# Patient Record
Sex: Female | Born: 1980 | Race: Asian | Marital: Married | Smoking: Never smoker
Health system: Southern US, Community
[De-identification: ages and names within clinical notes are randomized; demographics above are authoritative.]

---

## 2021-01-04 ENCOUNTER — Encounter: Payer: Self-pay | Admitting: Cardiovascular Disease

## 2021-01-04 ENCOUNTER — Other Ambulatory Visit: Payer: Self-pay | Admitting: Cardiovascular Disease

## 2021-01-04 ENCOUNTER — Other Ambulatory Visit: Payer: Self-pay

## 2021-01-04 ENCOUNTER — Ambulatory Visit: Payer: Self-pay | Admitting: Cardiovascular Disease

## 2021-01-04 DIAGNOSIS — J029 Acute pharyngitis, unspecified: Secondary | ICD-10-CM | POA: Insufficient documentation

## 2021-01-04 DIAGNOSIS — M545 Low back pain, unspecified: Secondary | ICD-10-CM

## 2021-01-04 DIAGNOSIS — I10 Essential (primary) hypertension: Secondary | ICD-10-CM

## 2021-01-04 DIAGNOSIS — M549 Dorsalgia, unspecified: Secondary | ICD-10-CM | POA: Insufficient documentation

## 2021-01-04 MED ORDER — MELOXICAM 15 MG PO TABS: 15.0000 mg | ORAL_TABLET | Freq: Every day | ORAL | 4 refills | Status: DC

## 2021-01-04 MED ORDER — HYDROCORTISONE ACETATE 25 MG RE SUPP: 25.0000 mg | Freq: Two times a day (BID) | RECTAL | 0 refills | Status: DC

## 2021-01-04 NOTE — Progress Notes (Signed)
[name]  Date:  01/04/2021   ID:  Christina Robertson, DOB 1980-12-08, MRN [id number]  PCP:  Pcp, No    Chief Complaint  Patient presents with  . Hypertension  . Sore Throat      History of Present Illness: Christina Robertson is a 40 y.o. female who presents to establish care in this clinic.  She moved with her husband from [place].  She has history of GERD on chronic back pain requiring tramadol and meloxicam. In addition, she has chronic hemorrhoids with significant discomfort.  She uses MiraLAX for constipation. She had recent sore throat.  Her husband tested positive for COVID but she tested negative.  She has no shortness of breath, chest pain, fever or cough.    History reviewed. No pertinent past medical history.  History reviewed. No pertinent surgical history.   Current Outpatient Medications  Medication Sig Dispense Refill  . cetirizine-pseudoephedrine (ZYRTEC-D) 5-120 MG tablet Take 1 tablet by mouth daily. He has been taking it for 3 days for sore throat     No current facility-administered medications for this visit.    Allergies:   Patient has no allergy information on record.    Social History:  The patient  reports that she has never smoked. She has never used smokeless tobacco. She reports that she does not drink alcohol and does not use drugs.   Family History:  The patient's family history includes Diabetes in her father, mother, and sister; Hypertension in her sister.    ROS:  Please see the history of present illness.   Otherwise, review of systems are positive for none.   All other systems are reviewed and negative.    PHYSICAL EXAM: VS:  BP (!) 136/92 (BP Location: Right Arm, Patient Position: Sitting, Cuff Size: Normal)   Pulse 88   Temp 98.3 F (36.8 C)   Ht 6' 1.23" (1.86 m)   Wt 240 lb 3.2 oz (109 kg)   SpO2 98%   BMI 31.49 kg/m  , BMI Body mass index is 31.49 kg/m. GEN: Well nourished, well developed, in no acute distress  HEENT: normal   Neck: no JVD, carotid bruits, or masses Cardiac: RRR; no murmurs, rubs, or gallops,no edema  Respiratory:  clear to auscultation bilaterally, normal work of breathing GI: soft, nontender, nondistended, + BS MS: no deformity or atrophy  Skin: warm and dry, no rash Neuro:  Strength and sensation are intact Psych: euthymic mood, full affect     Recent Labs: No results found for requested labs within last 8760 hours.    Lipid Panel No results found for: CHOL, TRIG, HDL, CHOLHDL, VLDL, LDLCALC, LDLDIRECT    Wt Readings from Last 3 Encounters:  01/04/21 240 lb 3.2 oz (109 kg)       No flowsheet data found.    ASSESSMENT AND PLAN:  1.  Hemorrhoids: She seems to be symptomatic with significant discomfort.  This is a chronic issue for her.  She uses MiraLAX for constipation.  I advised her to start using Metamucil twice a day on a regular basis.  In addition, I prescribed Anusol suppositories for 6 days.  Surgery should be a last resort.  2.  Chronic pain: She is on tramadol which I do not think is a good option considering her young age but she reports being on it for 4 years.  I am referring her to the pain clinic with Dr. Welton Flakes.   3.  Sore throat: Overall very mild symptoms.  She might be in the early stages of COVID infection.  Continue symptomatic management.    Disposition:    Signed,  Lorine Bears, MD  01/04/2021 1:29 PM

## 2021-01-04 NOTE — Patient Instructions (Signed)
Start drinking Methamucil twice daily with meal.  Start Anusol Supp for 6 days.   Needs pain clinic.

## 2021-01-06 LAB — NOVEL CORONAVIRUS, NAA: SARS-CoV-2, NAA: NOT DETECTED

## 2021-01-06 LAB — SARS-COV-2, NAA 2 DAY TAT

## 2021-01-06 LAB — SPECIMEN STATUS REPORT

## 2021-01-18 ENCOUNTER — Encounter: Payer: Self-pay | Admitting: Internal Medicine

## 2021-01-18 ENCOUNTER — Ambulatory Visit: Payer: Self-pay | Admitting: Internal Medicine

## 2021-01-18 ENCOUNTER — Other Ambulatory Visit: Payer: Self-pay

## 2021-01-18 VITALS — BP 116/89 | HR 75 | Temp 98.7°F | Ht 64.17 in | Wt 168.2 lb

## 2021-01-18 DIAGNOSIS — H60502 Unspecified acute noninfective otitis externa, left ear: Secondary | ICD-10-CM | POA: Insufficient documentation

## 2021-01-18 MED ORDER — OFLOXACIN 0.3 % OP SOLN: 1.0000 [drp] | Freq: Four times a day (QID) | OPHTHALMIC | 0 refills | Status: DC

## 2021-01-18 NOTE — Assessment & Plan Note (Addendum)
Patient has evidence of otitis externa of the inner left ear. Will start her on Ofloxacin. Advised to return back to the dentist for a dental consult.

## 2021-01-18 NOTE — Progress Notes (Signed)
Acute Office Visit  Subjective:    Patient ID: Christina Robertson, female    DOB: 05-07-1981, 40 y.o.   MRN: [id number]  Left ear ache and left jaw pain and toothache. Pain has lasted 4 days. No history of fever, chills or sweating. Patient has trouble chewing on the left side. Patient also has pain in her left ear.   HPI Patient is in today for   History reviewed. No pertinent past medical history.  History reviewed. No pertinent surgical history.  Family History  Problem Relation Age of Onset   Diabetes Mother     Social History   Socioeconomic History   Marital status: Married    Spouse name: Not on file   Number of children: Not on file   Years of education: Not on file   Highest education level: Not on file  Occupational History   Not on file  Tobacco Use   Smoking status: Never   Smokeless tobacco: Never  Vaping Use   Vaping Use: Never used  Substance and Sexual Activity   Alcohol use: Never   Drug use: Never   Sexual activity: Yes  Other Topics Concern   Not on file  Social History Narrative   Not on file   Social Determinants of Health   Financial Resource Strain: Not on file  Food Insecurity: Not on file  Transportation Needs: Not on file  Physical Activity: Not on file  Stress: Not on file  Social Connections: Not on file  Intimate Partner Violence: Not on file    Outpatient Medications Prior to Visit  Medication Sig Dispense Refill   meloxicam (MOBIC) 15 MG tablet Take 1 tablet (15 mg total) by mouth daily. 30 tablet 4   omeprazole (PRILOSEC) 20 MG capsule Take 20 mg by mouth daily.     traMADol-acetaminophen (ULTRACET) 37.5-325 MG tablet Take 1 tablet by mouth every 8 (eight) hours as needed.     cetirizine-pseudoephedrine (ZYRTEC-D) 5-120 MG tablet Take 1 tablet by mouth daily. He has been taking it for 3 days for sore throat     hydrocortisone (ANUSOL-HC) 25 MG suppository Place 1 suppository (25 mg total) rectally 2 (two) times daily. 12  suppository 0   No facility-administered medications prior to visit.    Allergies  Allergen Reactions   Metamucil [Psyllium]     Review of Systems  Constitutional: Negative.   HENT: Negative.    Eyes: Negative.   Respiratory: Negative.    Cardiovascular: Negative.   Gastrointestinal: Negative.   Endocrine: Negative.   Genitourinary: Negative.   Musculoskeletal: Negative.   Skin: Negative.   Allergic/Immunologic: Negative.   Neurological: Negative.   Hematological: Negative.   Psychiatric/Behavioral: Negative.    All other systems reviewed and are negative.     Objective:    Physical Exam HENT:     Head: Normocephalic.     Left Ear: External ear normal.     Ears:     Comments: Evidence of otitis external on the left ear.     Nose: Nose normal.  Neurological:     Mental Status: She is alert.    BP 116/89 (BP Location: Right Arm, Patient Position: Sitting, Cuff Size: Normal)   Pulse 75   Temp 98.7 F (37.1 C) (Oral)   Ht 5' 4.17" (1.63 m)   Wt 168 lb 3.2 oz (76.3 kg)   SpO2 98%   BMI 28.72 kg/m  Wt Readings from Last 3 Encounters:  01/18/21 168 lb 3.2  oz (76.3 kg)  01/04/21 240 lb 3.2 oz (109 kg)    Health Maintenance Due  Topic Date Due   HIV Screening  Never done   Hepatitis C Screening  Never done   TETANUS/TDAP  Never done   PAP SMEAR-Modifier  Never done    There are no preventive care reminders to display for this patient.   No results found for: TSH No results found for: WBC, HGB, HCT, MCV, PLT No results found for: NA, K, CHLORIDE, CO2, GLUCOSE, BUN, CREATININE, BILITOT, ALKPHOS, AST, ALT, PROT, ALBUMIN, CALCIUM, ANIONGAP, EGFR, GFR No results found for: CHOL No results found for: HDL No results found for: LDLCALC No results found for: TRIG No results found for: CHOLHDL No results found for: HGBA1C     Assessment & Plan:   Problem List Items Addressed This Visit       Nervous and Auditory   Acute otitis externa of left ear -  Primary    Patient has evidence of otitis externa of the inner left ear. Will start her on Ofloxacin. Advised to return back to the dentist for a dental consult.         Meds ordered this encounter  Medications   ofloxacin (OCUFLOX) 0.3 % ophthalmic solution    Sig: Place 1 drop into the left ear 4 (four) times daily.    Dispense:  5 mL    Refill:  0     Cletis Athens, MD

## 2021-09-12 ENCOUNTER — Other Ambulatory Visit: Payer: Self-pay

## 2021-09-12 ENCOUNTER — Encounter (HOSPITAL_BASED_OUTPATIENT_CLINIC_OR_DEPARTMENT_OTHER): Payer: Self-pay | Admitting: *Deleted

## 2021-09-12 ENCOUNTER — Emergency Department (HOSPITAL_BASED_OUTPATIENT_CLINIC_OR_DEPARTMENT_OTHER)
Admission: EM | Admit: 2021-09-12 | Discharge: 2021-09-12 | Disposition: A | Discharge status: Home / Self Care | Payer: 59 | Attending: Emergency Medicine | Admitting: Emergency Medicine

## 2021-09-12 DIAGNOSIS — M79672 Pain in left foot: Secondary | ICD-10-CM | POA: Diagnosis not present

## 2021-09-12 DIAGNOSIS — M79641 Pain in right hand: Secondary | ICD-10-CM | POA: Insufficient documentation

## 2021-09-12 MED ORDER — DULOXETINE HCL 20 MG PO CPEP: 20.0000 mg | ORAL_CAPSULE | Freq: Every day | ORAL | 0 refills | Status: DC

## 2021-09-12 MED ORDER — MELOXICAM 15 MG PO TABS: 15.0000 mg | ORAL_TABLET | Freq: Every day | ORAL | 0 refills | Status: AC

## 2021-09-12 NOTE — ED Triage Notes (Signed)
C/o rt hand radiating up arm and left foot pain x 2-3 months denies inj  pain is sharp

## 2021-09-12 NOTE — ED Provider Notes (Signed)
[facility] DEPART[facility] Provider Note   CSN: [id number] Arrival date & time: 09/12/21  1036     History  Chief Complaint  Patient presents with   Hand Pain   Foot Pain   Urdu translator was used throughout this evaluation  Christina Robertson is a 41 y.o. female.  HPI  41 year old female presents to the emergency department today complaining of right hand pain and left heel pain.  States pain has been present for several months.  She was previously seen by her PCP about the symptoms and had been referred to pain management for her symptoms.  She had been on tramadol as well as meloxicam in the past which has provided her with relief.  Her insurance recently changed so she is no longer able to see her prior PCP so she would like refills of her medications and to try to establish care with a new PCP.  She is also requesting a refill of her duloxetine.  Home Medications Prior to Admission medications   Medication Sig Start Date End Date Taking? Authorizing Provider  DULoxetine (CYMBALTA) 20 MG capsule Take 20 mg by mouth daily.   Yes [provider]  DULoxetine (CYMBALTA) 20 MG capsule Take 1 capsule (20 mg total) by mouth daily. 09/12/21 10/12/21 Yes Delyla Sandeen S, PA-C  meloxicam (MOBIC) 15 MG tablet Take 1 tablet (15 mg total) by mouth daily. 09/12/21 10/12/21 Yes Arius Harnois S, PA-C  meloxicam (MOBIC) 15 MG tablet Take 1 tablet (15 mg total) by mouth daily. 01/04/21   Wellington Hampshire, MD  ofloxacin (OCUFLOX) 0.3 % ophthalmic solution Place 1 drop into the left ear 4 (four) times daily. 01/18/21   Cletis Athens, MD  omeprazole (PRILOSEC) 20 MG capsule Take 20 mg by mouth daily.    [provider]  traMADol-acetaminophen (ULTRACET) 37.5-325 MG tablet Take 1 tablet by mouth every 8 (eight) hours as needed.    [provider]      Allergies    Patient has no active allergies.    Review of Systems   Review of Systems See HPI for  pertinent positives or negatives.   Physical Exam Updated Vital Signs BP (!) 133/94 (BP Location: Left Arm)    Pulse 89    Temp 98.2 F (36.8 C) (Oral)    Resp 17    Ht 5\' 2"  (1.575 m)    Wt 72 kg    LMP 09/04/2021 (Exact Date)    SpO2 100%    BMI 29.03 kg/m  Physical Exam Vitals and nursing note reviewed.  Constitutional:      General: She is not in acute distress.    Appearance: She is well-developed.  HENT:     Head: Normocephalic and atraumatic.  Eyes:     Conjunctiva/sclera: Conjunctivae normal.  Cardiovascular:     Rate and Rhythm: Normal rate.  Pulmonary:     Effort: Pulmonary effort is normal.  Musculoskeletal:        General: Normal range of motion.     Cervical back: Neck supple.     Comments: TTP to the right hand over the thenar eminence and carpal tunnel which reproduces pain. Mild TTP also noted to the left lateral heel. No skin changes, swelling noted. NVI.   Skin:    General: Skin is warm and dry.  Neurological:     Mental Status: She is alert.    ED Results / Procedures / Treatments   Labs (all labs ordered are  listed, but only abnormal results are displayed) Labs Reviewed - No data to display  EKG None  Radiology No results found.  Procedures Procedures  SPLINT APPLICATION Date/Time: 123456 PM Authorized by: Rodney Booze Consent: Verbal consent obtained. Risks and benefits: risks, benefits and alternatives were discussed Consent given by: patient Splint applied by: technician Location details: RUE Splint type: thumb spica Supplies used: velcro wrist splint Post-procedure: The splinted body part was neurovascularly unchanged following the procedure. Patient tolerance: Patient tolerated the procedure well with no immediate complications.     Medications Ordered in ED Medications - No data to display  ED Course/ Medical Decision Making/ A&P                           Medical Decision Making  41 year old female presents emergency  department today for medication refill.  She has chronic pain to the right hand/wrist area that appears to be consistent with carpal tunnel.  She also has pain to the left heel.  This has been ongoing chronically and previously she was on tramadol and meloxicam for her symptoms.  She has been unable to follow-up with her prior PCP who managed the symptoms due to insurance changes.  We will refill her meloxicam, give a wrist splint for her pain as well as refill her duloxetine prescription.  We will have her follow-up to establish care with new PCP and return to the ED for any new or worsening symptoms in the meantime.  Social determinants of health are patient's lack of PCP. Ddx also includes arthritis, stress fx though have lower suspicion for these   Final Clinical Impression(s) / ED Diagnoses Final diagnoses:  Right hand pain  Foot pain, left    Rx / DC Orders ED Discharge Orders          Ordered    DULoxetine (CYMBALTA) 20 MG capsule  Daily        09/12/21 1333    meloxicam (MOBIC) 15 MG tablet  Daily        09/12/21 [address] Alexzia Kasler S, PA-C 09/12/21 Letcher, Dan, DO 09/12/21 1542

## 2021-09-12 NOTE — ED Notes (Signed)
Pt c/o right hand and left foot pain.  Pt states her MD prescribed medication for it and she needs a refill.

## 2021-09-12 NOTE — Discharge Instructions (Addendum)
Take meloxicam as directed .  Take duloxetine as directed   Please follow up with your primary care provider within 5-7 days for re-evaluation of your symptoms. If you do not have a primary care provider, information for a healthcare clinic has been provided for you to make arrangements for follow up care. I have also given you information for a sports medicine doctor. Please return to the emergency department for any new or worsening symptoms.

## 2021-09-12 NOTE — ED Notes (Signed)
Interrupter used for triage

## 2021-09-26 ENCOUNTER — Ambulatory Visit: Payer: Self-pay

## 2021-09-26 ENCOUNTER — Ambulatory Visit: Payer: 59 | Admitting: Family Medicine

## 2021-09-26 ENCOUNTER — Encounter: Payer: Self-pay | Admitting: Family Medicine

## 2021-09-26 VITALS — BP 118/80 | Ht 62.0 in | Wt 158.0 lb

## 2021-09-26 DIAGNOSIS — M7672 Peroneal tendinitis, left leg: Secondary | ICD-10-CM | POA: Insufficient documentation

## 2021-09-26 DIAGNOSIS — G5601 Carpal tunnel syndrome, right upper limb: Secondary | ICD-10-CM | POA: Insufficient documentation

## 2021-09-26 DIAGNOSIS — M25531 Pain in right wrist: Secondary | ICD-10-CM

## 2021-09-26 MED ORDER — GABAPENTIN 100 MG PO CAPS: 100.0000 mg | ORAL_CAPSULE | Freq: Three times a day (TID) | ORAL | 3 refills | Status: DC | PRN

## 2021-09-26 NOTE — Assessment & Plan Note (Signed)
Acutely occurring.  Symptoms seem more consistent with carpal tunnel as opposed to the [facility] joint. -Counseled on home exercise therapy and supportive care. -Gabapentin. -Could consider injection.

## 2021-09-26 NOTE — Assessment & Plan Note (Addendum)
Acutely occurring.  It is related to the translation that her midfoot has medially. -Counseled on home exercise therapy and supportive care. -Green sport insoles. -Counseled on meloxicam. -Could consider adding scaphoid pads or custom orthotics.

## 2021-09-26 NOTE — Patient Instructions (Signed)
Nice to meet you Please try the mobic for another week.  Please try the exercises  Please try the brace at night and with work.  Please use the green sport insoles   You can check with [facility] in [address] about primary care  Please try the gabapentin and stop the duloxetine. You can start with one gabapentin at night and increase to 2 or 3 times daily as you tolerate Please send me a message in MyChart with any questions or updates.  Please see me back in 4 weeks.   --Dr. Raeford Razor  ?? ?? ?? ?? ???? ???? ???? ??? ??? ??? ???? ?? ??? ????? ???????? ???? ??? ????? ???????? ???? ??? ??? ?? ??? ??? ?? ???? ???? ?? ???????? ???? ??? ???? ?????? ?????? ?? ??????? ????? ?? ??????? ???? ?? ???? ??? ???? [address] ?[address] [place] ?? ??? ?? ???? ???? ???? ??? ????????? ??????? ??? ??????????? ?? ?????? ?? ??? ?? ??? ??? ????????? ?? ???? ???? ?? ???? ??? ??? ?????? 2 ?? 3 ??? ?? ???? ???? ??? ???? ?? ?? ?????? ???? ???? ???? ??? ???? MyChart ??? ??? ??? ???? ?? ?? ??? ?? ???? ??? ????? ??????? ???? ??? ???? 4 ????? ??? ???? ?????

## 2021-09-26 NOTE — Progress Notes (Signed)
°  Christina Robertson - 41 y.o. female MRN [id number]  Date of birth: 05/21/1981  SUBJECTIVE:  Including CC & ROS.  No chief complaint on file.   Christina Robertson is a 41 y.o. female that is presenting with right hand pain and left lateral foot and ankle pain.  Her symptoms been intermittent for period of time.  They seem to be worse since she has been working.  No history of surgery.  A telephone interpreter was used for the entirety of this interview.  Review of the emergency department note from 2/10 shows she was provided Cymbalta and meloxicam.   Review of Systems See HPI   HISTORY: Past Medical, Surgical, Social, and Family History Reviewed & Updated per EMR.   Pertinent Historical Findings include:  History reviewed. No pertinent past medical history.  History reviewed. No pertinent surgical history.   PHYSICAL EXAM:  VS: BP 118/80 (BP Location: Left Arm, Patient Position: Sitting)    Ht 5\' 2"  (1.575 m)    Wt 158 lb (71.7 kg)    LMP 09/04/2021 (Exact Date)    BMI 28.90 kg/m  Physical Exam Gen: NAD, alert, cooperative with exam, well-appearing MSK:  Neurovascularly intact    Limited ultrasound: Right wrist, left ankle:  Right wrist: Median nerve is not significantly enlarged in the carpal tunnel. No changes of the CMC joint. No effusion within the wrist joint.  Left ankle: No effusion in the ankle joint. Normal-appearing Achilles tendon. Effusion and swelling with hyperemia of the peroneal tendons as it courses around the lateral malleolus.  Summary: Findings consistent with peroneal tendinitis.  Ultrasound and interpretation by 11/02/2021, MD    ASSESSMENT & PLAN:   I spent 60 minutes with this patient, greater than 50% was face-to-face time counseling regarding the below diagnosis.   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  Symptoms seem more consistent with carpal tunnel as opposed to the [facility] joint. -Counseled on home exercise  therapy and supportive care. -Gabapentin. -Could consider injection.  Peroneal tendinitis of lower leg, left Acutely occurring.  It is related to the translation that her midfoot has medially. -Counseled on home exercise therapy and supportive care. -Green sport insoles. -Counseled on meloxicam. -Could consider adding scaphoid pads or custom orthotics.

## 2021-10-10 ENCOUNTER — Encounter: Payer: Self-pay | Admitting: Emergency Medicine

## 2021-10-10 ENCOUNTER — Ambulatory Visit
Admission: EM | Admit: 2021-10-10 | Discharge: 2021-10-10 | Disposition: A | Discharge status: Home / Self Care | Payer: 59 | Attending: Emergency Medicine | Admitting: Emergency Medicine

## 2021-10-10 ENCOUNTER — Other Ambulatory Visit: Payer: Self-pay

## 2021-10-10 DIAGNOSIS — J029 Acute pharyngitis, unspecified: Secondary | ICD-10-CM | POA: Diagnosis not present

## 2021-10-10 LAB — POCT RAPID STREP A (OFFICE): Rapid Strep A Screen: NEGATIVE

## 2021-10-10 MED ORDER — OSELTAMIVIR PHOSPHATE 75 MG PO CAPS: 75.0000 mg | ORAL_CAPSULE | Freq: Two times a day (BID) | ORAL | 0 refills | Status: AC

## 2021-10-10 MED ORDER — IBUPROFEN 800 MG PO TABS
800.0000 mg | ORAL_TABLET | Freq: Once | ORAL | Status: AC
  Administered 2021-10-10: 800 mg via ORAL

## 2021-10-10 MED ORDER — LIDOCAINE VISCOUS HCL 2 % MT SOLN
15.0000 mL | Freq: Once | OROMUCOSAL | Status: AC
  Administered 2021-10-10: 15 mL via OROMUCOSAL

## 2021-10-10 NOTE — ED Provider Notes (Signed)
[facility]    CSN: [id number] Arrival date & time: 10/10/21  0801      History   Chief Complaint Chief Complaint  Patient presents with   Sore Throat   Cough   Nasal Congestion    HPI Christina Robertson is a 41 y.o. female.   Patient complains of sore throat, pain with swallowing, nasal congestion, fever and mild cough for the past 2 days.  Patient states this morning she took 1 g of acetaminophen.  Patient arrived with a temperature of 99.2.  Patient states has not tried any other remedies.  Patient denies seasonal allergies.  Patient denies known sick contacts.  Patient states cough is not productive, does not vary day or night.  The history is provided by the patient.   History reviewed. No pertinent past medical history.  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 No pertinent surgical history.  OB History   No obstetric history on file.      Home Medications    Prior to Admission medications   Medication Sig Start Date End Date Taking? Authorizing Provider  oseltamivir (TAMIFLU) 75 MG capsule Take 1 capsule (75 mg total) by mouth every 12 (twelve) hours for 5 days. 10/10/21 10/15/21 Yes Theadora Rama Scales, PA-C  DULoxetine (CYMBALTA) 20 MG capsule Take 20 mg by mouth daily.    [provider]  DULoxetine (CYMBALTA) 20 MG capsule Take 1 capsule (20 mg total) by mouth daily. 09/12/21 10/12/21  Couture, Cortni S, PA-C  gabapentin (NEURONTIN) 100 MG capsule Take 1 capsule (100 mg total) by mouth 3 (three) times daily as needed. 09/26/21   Myra Rude, MD  meloxicam (MOBIC) 15 MG tablet Take 1 tablet (15 mg total) by mouth daily. 01/04/21   Iran Ouch, MD  meloxicam (MOBIC) 15 MG tablet Take 1 tablet (15 mg  total) by mouth daily. 09/12/21 10/12/21  Couture, Cortni S, PA-C  ofloxacin (OCUFLOX) 0.3 % ophthalmic solution Place 1 drop into the left ear 4 (four) times daily. 01/18/21   Corky Downs, MD  omeprazole (PRILOSEC) 20 MG capsule Take 20 mg by mouth daily.    [provider]  traMADol-acetaminophen (ULTRACET) 37.5-325 MG tablet Take 1 tablet by mouth every 8 (eight) hours as needed.    [provider]    Family History Family History  Problem Relation Age of Onset   Diabetes Mother     Social History Social History   Tobacco Use   Smoking status: Never   Smokeless tobacco: Never  Vaping Use   Vaping Use: Never used  Substance Use Topics   Alcohol use: Never   Drug use: Never     Allergies   Other   Review of Systems Review of Systems   Physical Exam Triage Vital Signs ED Triage Vitals [10/10/21 0811]  Enc Vitals Group     BP 122/83     Pulse Rate 98     Resp 18     Temp 99.2 F (37.3 C)     Temp Source Oral     SpO2 97 %     Weight      Height      Head Circumference      Peak  Flow      Pain Score      Pain Loc      Pain Edu?      Excl. in GC?    No data found.  Updated Vital Signs BP 122/83 (BP Location: Left Arm)    Pulse 98    Temp 99.2 F (37.3 C) (Oral)    Resp 18    LMP 10/01/2021    SpO2 97%   Visual Acuity Right Eye Distance:   Left Eye Distance:   Bilateral Distance:    Right Eye Near:   Left Eye Near:    Bilateral Near:     Physical Exam Constitutional:      Appearance: She is ill-appearing.  HENT:     Head: Normocephalic and atraumatic.     Salivary Glands: Right salivary gland is not diffusely enlarged or tender. Left salivary gland is not diffusely enlarged or tender.     Right Ear: Tympanic membrane, ear canal and external ear normal.     Left Ear: Tympanic membrane, ear canal and external ear normal.     Nose: Congestion and rhinorrhea present. Rhinorrhea is clear.     Right Sinus: No maxillary sinus  tenderness or frontal sinus tenderness.     Left Sinus: No maxillary sinus tenderness.     Mouth/Throat:     Mouth: Mucous membranes are moist.     Pharynx: Pharyngeal swelling, posterior oropharyngeal erythema and uvula swelling present.     Tonsils: No tonsillar exudate. 0 on the right. 0 on the left.  Cardiovascular:     Rate and Rhythm: Normal rate and regular rhythm.     Pulses: Normal pulses.  Pulmonary:     Effort: Pulmonary effort is normal. No accessory muscle usage, prolonged expiration or respiratory distress.     Breath sounds: No stridor. No wheezing, rhonchi or rales.     Comments: Turbulent breath sounds throughout without wheeze, rale, rhonchi. Abdominal:     General: Abdomen is flat. Bowel sounds are normal.     Palpations: Abdomen is soft.  Musculoskeletal:        General: Normal range of motion.     Cervical back: Normal range of motion and neck supple.  Lymphadenopathy:     Cervical: Cervical adenopathy present.     Right cervical: Superficial cervical adenopathy and posterior cervical adenopathy present.     Left cervical: Superficial cervical adenopathy and posterior cervical adenopathy present.  Skin:    General: Skin is warm and dry.  Neurological:     General: No focal deficit present.     Mental Status: She is alert and oriented to person, place, and time.     Motor: Motor function is intact.     Coordination: Coordination is intact.     Gait: Gait is intact.     Deep Tendon Reflexes: Reflexes are normal and symmetric.  Psychiatric:        Attention and Perception: Attention and perception normal.        Mood and Affect: Mood and affect normal.        Speech: Speech normal.        Behavior: Behavior normal. Behavior is cooperative.        Thought Content: Thought content normal.     UC Treatments / Results  Labs (all labs ordered are listed, but only abnormal results are displayed) Labs Reviewed  POCT RAPID STREP A (OFFICE) - Normal  CULTURE,  GROUP A STREP ([facility])  COVID-19, FLU  A+B NAA    EKG   Radiology No results found.  Procedures Procedures (including critical care time)  Medications Ordered in UC Medications  lidocaine (XYLOCAINE) 2 % viscous mouth solution 15 mL (15 mLs Mouth/Throat Given 10/10/21 0911)  ibuprofen (ADVIL) tablet 800 mg (800 mg Oral Given 10/10/21 0911)    Initial Impression / Assessment and Plan / UC Course  I have reviewed the triage vital signs and the nursing notes.  Pertinent labs & imaging results that were available during my care of the patient were reviewed by me and considered in my medical decision making (see chart for details).     Rapid strep test today is negative, throat culture will be performed per protocol.  COVID-19 and flu testing also performed.  Patient started empirically on Tamiflu given physical exam findings and acute onset of fever within the last 48 hours.  Patient advised that if COVID-19 result is positive, we will provide her with Paxlovid and she can discontinue Tamiflu.  Patient also advised that if strep test is positive, she will be provided with antibiotics and, if she is taking any antivirals, she should continue both.  Conservative care recommended.  Supportive medications recommended.  Return precautions advised. Final Clinical Impressions(s) / UC Diagnoses   Final diagnoses:  Pharyngitis, unspecified etiology     Discharge Instructions      You were tested for both COVID-19 and influenza today because here in the urgent care setting, we do not have an available option for an individual influenza test.  The result of your viral testing will be posted to your MyChart once it is complete, this typically takes 24 to 48 hours.  If there is a positive result, you will be contacted by phone with further recommendations, if any.    I recommend that you begin taking Tamiflu now for empiric treatment of presumed influenza based on the history provided to me today  along with my physical exam findings. Tamiflu is an antiviral medication that decreases the severity, duration and transmissibility of influenza virus by preventing the virus from reproducing itself in your body.     If the influenza result is positive, please continue the full 5-day course of Tamiflu.  If the result is negative, please feel free to discontinue Tamiflu if you prefer but do keep in mind that Tamiflu can also be taken preventatively.  Finishing the full 5-day course will decrease the chances of catching influenza from anyone else and will not cause harm otherwise.   COVID-19 test is positive, you will stop taking Tamiflu and begin Paxlovid which will be prescribed for you.    Your streptococcal pharyngitis rapid test today is negative.  Throat culture will be performed per our protocol for definitive ruling in or ruling out of streptococcal infection.  The result of your throat culture will be posted to your MyChart once it is complete, this typically takes 3 to 5 days.  If there is a positive result, you will be contacted by phone and antibiotics will be prescribed for you.  If either your COVID or flu tests are positive and you are taking antivirals, please continue taking these as well along with the antibiotics for streptococcal pharyngitis.   Please see the list below for recommended medications, dosages and frequencies to provide relief of your current symptoms:     Ibuprofen  (Advil, Motrin): This is a good anti-inflammatory medication which addresses aches, pains and inflammation of the upper airways that causes sinus and nasal  congestion as well as in the lower airways which makes your cough feel tight and sometimes burn.  I recommend that you take between 400 to 600 mg every 6-8 hours as needed, I have provided you with a prescription for 400 mg.      Acetaminophen (Tylenol): This is a good fever reducer.  If your body temperature rises above 101.5 as measured with a thermometer,  it is recommended that you take 1,000 mg every 8 hours until your temperature falls below 101.5, please not take more than 3,000 mg of acetaminophen either as a separate medication or as in ingredient in an over-the-counter cold/flu preparation within a 24-hour period.     Lidocaine (Xylocaine): This is a numbing medication that can be swished for 15 seconds and swallowed.  You can use this every 3 hours while awake to relieve pain in your mouth and throat.  I have sent a prescription for this medication to your pharmacy.   Promethazine DM: Promethazine is both a nasal decongestant and an antinausea medication that makes most patients feel fairly sleepy.  The DM is dextromethorphan, a cough suppressant found in many over-the-counter cough medications.  Please take 5 mL before bedtime to minimize your cough which will help you sleep better.  I have provided you with a prescription for this medication.      Please follow-up within the next 5-7 days either with your primary care provider or urgent care if your symptoms do not resolve.  If you do not have a primary care provider, we will assist you in finding one.   Thank you for visiting urgent care today.  We appreciate the opportunity to participate in your care.     ED Prescriptions     Medication Sig Dispense Auth. Provider   oseltamivir (TAMIFLU) 75 MG capsule Take 1 capsule (75 mg total) by mouth every 12 (twelve) hours for 5 days. 10 capsule Theadora RamaMorgan, Roylene Heaton Scales, PA-C      PDMP not reviewed this encounter.   Theadora RamaMorgan, Bralee Feldt Scales, PA-C 10/10/21 1216

## 2021-10-10 NOTE — ED Triage Notes (Signed)
Pt c/o sore throat, congestion, fevers, and little cough for 2 days. In mornings takes Acetaminophen 1g  ?

## 2021-10-10 NOTE — Discharge Instructions (Addendum)
You were tested for both COVID-19 and influenza today because here in the urgent care setting, we do not have an available option for an individual influenza test.  The result of your viral testing will be posted to your MyChart once it is complete, this typically takes 24 to 48 hours.  If there is a positive result, you will be contacted by phone with further recommendations, if any.  ?  ?I recommend that you begin taking Tamiflu now for empiric treatment of presumed influenza based on the history provided to me today along with my physical exam findings. Tamiflu is an antiviral medication that decreases the severity, duration and transmissibility of influenza virus by preventing the virus from reproducing itself in your body.   ?  ?If the influenza result is positive, please continue the full 5-day course of Tamiflu.  If the result is negative, please feel free to discontinue Tamiflu if you prefer but do keep in mind that Tamiflu can also be taken preventatively.  Finishing the full 5-day course will decrease the chances of catching influenza from anyone else and will not cause harm otherwise.  ? ?COVID-19 test is positive, you will stop taking Tamiflu and begin Paxlovid which will be prescribed for you. ?   ?Your streptococcal pharyngitis rapid test today is negative.  Throat culture will be performed per our protocol for definitive ruling in or ruling out of streptococcal infection.  The result of your throat culture will be posted to your MyChart once it is complete, this typically takes 3 to 5 days.  If there is a positive result, you will be contacted by phone and antibiotics will be prescribed for you.  If either your COVID or flu tests are positive and you are taking antivirals, please continue taking these as well along with the antibiotics for streptococcal pharyngitis. ?  ?Please see the list below for recommended medications, dosages and frequencies to provide relief of your current symptoms:   ?   ?Ibuprofen  (Advil, Motrin): This is a good anti-inflammatory medication which addresses aches, pains and inflammation of the upper airways that causes sinus and nasal congestion as well as in the lower airways which makes your cough feel tight and sometimes burn.  I recommend that you take between 400 to 600 mg every 6-8 hours as needed, I have provided you with a prescription for 400 mg.    ?  ?Acetaminophen (Tylenol): This is a good fever reducer.  If your body temperature rises above 101.5 as measured with a thermometer, it is recommended that you take 1,000 mg every 8 hours until your temperature falls below 101.5, please not take more than 3,000 mg of acetaminophen either as a separate medication or as in ingredient in an over-the-counter cold/flu preparation within a 24-hour period.    ? ?Lidocaine (Xylocaine): This is a numbing medication that can be swished for 15 seconds and swallowed.  You can use this every 3 hours while awake to relieve pain in your mouth and throat.  I have sent a prescription for this medication to your pharmacy. ?  ?Promethazine DM: Promethazine is both a nasal decongestant and an antinausea medication that makes most patients feel fairly sleepy.  The DM is dextromethorphan, a cough suppressant found in many over-the-counter cough medications.  Please take 5 mL before bedtime to minimize your cough which will help you sleep better.  I have provided you with a prescription for this medication.    ?  ?Please follow-up within the  next 5-7 days either with your primary care provider or urgent care if your symptoms do not resolve.  If you do not have a primary care provider, we will assist you in finding one. ?  ?Thank you for visiting urgent care today.  We appreciate the opportunity to participate in your care. ? ?

## 2021-10-12 LAB — COVID-19, FLU A+B NAA
Influenza A, NAA: NOT DETECTED
Influenza B, NAA: NOT DETECTED
SARS-CoV-2, NAA: NOT DETECTED

## 2021-10-13 LAB — CULTURE, GROUP A STREP (THRC)

## 2021-10-17 ENCOUNTER — Ambulatory Visit: Payer: 59 | Admitting: Family Medicine

## 2021-10-17 ENCOUNTER — Ambulatory Visit
Admission: EM | Admit: 2021-10-17 | Discharge: 2021-10-17 | Disposition: A | Discharge status: Home / Self Care | Payer: 59 | Attending: Emergency Medicine | Admitting: Emergency Medicine

## 2021-10-17 ENCOUNTER — Other Ambulatory Visit: Payer: Self-pay

## 2021-10-17 DIAGNOSIS — K2100 Gastro-esophageal reflux disease with esophagitis, without bleeding: Secondary | ICD-10-CM | POA: Diagnosis not present

## 2021-10-17 DIAGNOSIS — J302 Other seasonal allergic rhinitis: Secondary | ICD-10-CM | POA: Diagnosis not present

## 2021-10-17 MED ORDER — LANSOPRAZOLE 30 MG PO CPDR
30.0000 mg | DELAYED_RELEASE_CAPSULE | Freq: Two times a day (BID) | ORAL | 0 refills | Status: AC
Start: 2021-10-17 — End: 2022-01-15

## 2021-10-17 MED ORDER — FLUTICASONE PROPIONATE 50 MCG/ACT NA SUSP: 2.0000 | Freq: Every day | NASAL | 0 refills | Status: DC

## 2021-10-17 MED ORDER — MOMETASONE FUROATE 50 MCG/ACT NA SUSP: 2.0000 | Freq: Every day | NASAL | 0 refills | Status: DC

## 2021-10-17 MED ORDER — FEXOFENADINE HCL 180 MG PO TABS: 180.0000 mg | ORAL_TABLET | Freq: Every day | ORAL | 0 refills | Status: DC

## 2021-10-17 NOTE — Discharge Instructions (Addendum)
For your heartburn symptoms, which is causing pain in your throat and your upper abdomen, please begin Prevacid, 1 capsule twice daily for the next 2 weeks.  After 2 weeks, please feel free to reduce your dose to once daily.  I have provided you with a coupon for 180 capsules to use at [facility] without your insurance which is for a maximum price of $43.65. ? ?Please discuss this medication with your new primary care provider at your upcoming appointment on November 14, 2021.  Please also discuss having testing done for H. pylori infection.  H. pylori infection can cause significant worsening of heartburn symptoms, erosion of the esophagus, dry cough, sore throat and ulcers in the stomach.  H. pylori infection can also give you mild fever, body aches, cause nausea, make you feel less hungry and not able to eat as much food.  I have enclosed information about H. pylori for you to read. ? ?For your allergy symptoms, I sent prescription for fexofenadine (Allegra) to your pharmacy.  [facility]'s over-the-counter price for this medication is $15.88.  You will not need a coupon for this.  If your insurance will not pay for this medication or if your insurance cost is higher than $15.88, ask [facility] not to run it through your insurance and to keep you there over the counter price. ? ?I also sent a prescription for 2 nasal steroids, mometasone (Nasonex) and fluticasone (Flonase).  I cannot tell which one your insurance will pay for, if either.  Nasonex will be more expensive over-the-counter than Flonase well.  I sent both prescriptions so that you can choose the better price.  Both of them are equally effective.  Please spray 1 spray in each nare daily for best results. ? ?Thank you for visiting urgent care today.  We appreciate the opportunity to participate in your care. ?

## 2021-10-17 NOTE — ED Provider Notes (Signed)
[facility]    CSN: [id number] Arrival date & time: 10/17/21  8295    HISTORY   Chief Complaint  Patient presents with   Abdominal Pain   Sore Throat   HPI Christina Robertson is a 41 y.o. female. Patient complains of sore throat and abdominal pain for the past week.  Patient was actually here 7 days ago at which time she complained of sore throat, pain with swallowing, nasal congestion, fever and mild cough for the previous 2 days.  Rapid strep test, throat culture, COVID test and influenza tests were all negative.  Patient's vital signs today are, again, normal on arrival.  Patient states that the pain she is having is now no longer in the upper aspect of her throat but now moved into the lower aspect of her throat, patient points to the area just above her sternal notch.  Patient also states that she is having abdominal pain pain that is most intense in her epigastric area but also present at lesser intensity and the rest of her abdomen.  Patient denies history of constipation, states her bowels move well that her stools soft.  When asked about the prescription for omeprazole in her chart, patient states she was prescribed this medication by provider in [place] for her to take when she is taking other medication.  Patient states she is currently not taking omeprazole at this time.  Patient states she is taking meloxicam and gabapentin.  Patient also endorses sinus drainage and runny nose, states when she blows her nose sometimes there are streaks of blood.  Patient denies sinus pressure, sinus pain, ear pressure, ear pain, muffled hearing, difficulty swallowing, headache, fever, chills, neck pain, body ache, nausea, vomiting.  Patient does endorse dry cough which is usually worse at night but states that it does not wake her up.  The history is provided by the patient.  History reviewed. No pertinent past medical history. Patient Active Problem List   Diagnosis Date Noted   Carpal  tunnel syndrome of right wrist 09/26/2021   Peroneal tendinitis of lower leg, left 09/26/2021   Acute otitis externa of left ear 01/18/2021   Back pain 01/04/2021   Sore throat 01/04/2021   History reviewed. No pertinent surgical history. OB History   No obstetric history on file.    Home Medications    Prior to Admission medications   Medication Sig Start Date End Date Taking? Authorizing Provider  DULoxetine (CYMBALTA) 20 MG capsule Take 1 capsule (20 mg total) by mouth daily. 09/12/21 10/12/21  Couture, Cortni S, PA-C  gabapentin (NEURONTIN) 100 MG capsule Take 1 capsule (100 mg total) by mouth 3 (three) times daily as needed. 09/26/21   Myra Rude, MD  meloxicam (MOBIC) 15 MG tablet Take 1 tablet (15 mg total) by mouth daily. 01/04/21   Iran Ouch, MD  omeprazole (PRILOSEC) 20 MG capsule Take 20 mg by mouth daily.    [provider]   Family History Family History  Problem Relation Age of Onset   Diabetes Mother    Social History Social History   Tobacco Use   Smoking status: Never   Smokeless tobacco: Never  Vaping Use   Vaping Use: Never used  Substance Use Topics   Alcohol use: Never   Drug use: Never   Allergies   Other  Review of Systems Review of Systems Pertinent findings noted in history of present illness.   Physical Exam Triage Vital Signs ED Triage Vitals  Enc Vitals Group     BP 05/30/21 0827 (!) 147/82     Pulse Rate 05/30/21 0827 72     Resp 05/30/21 0827 18     Temp 05/30/21 0827 98.3 F (36.8 C)     Temp Source 05/30/21 0827 Oral     SpO2 05/30/21 0827 98 %     Weight --      Height --      Head Circumference --      Peak Flow --      Pain Score 05/30/21 0826 5     Pain Loc --      Pain Edu? --      Excl. in GC? --   No data found.  Updated Vital Signs BP (!) 143/96 (BP Location: Right Arm)   Pulse 88   Temp 98.7 F (37.1 C) (Oral)   Resp 18   LMP 10/01/2021   SpO2 97%   Physical Exam Vitals and nursing  note reviewed.  Constitutional:      General: She is awake. She is not in acute distress.    Appearance: Normal appearance. She is well-developed, well-groomed and normal weight. She is not ill-appearing, toxic-appearing or diaphoretic.  HENT:     Head: Normocephalic and atraumatic.     Salivary Glands: Right salivary gland is not diffusely enlarged or tender. Left salivary gland is not diffusely enlarged or tender.     Right Ear: Ear canal and external ear normal. No drainage. A middle ear effusion is present. There is no impacted cerumen. Tympanic membrane is bulging. Tympanic membrane is not injected or erythematous.     Left Ear: Ear canal and external ear normal. No drainage. A middle ear effusion is present. There is no impacted cerumen. Tympanic membrane is bulging. Tympanic membrane is not injected or erythematous.     Ears:     Comments: Bilateral EACs normal, both TMs bulging with clear fluid    Nose: Mucosal edema, congestion and rhinorrhea present. No nasal deformity, septal deviation, signs of injury or nasal tenderness. Rhinorrhea is clear.     Right Nostril: Occlusion present. No foreign body, epistaxis or septal hematoma.     Left Nostril: Occlusion present. No foreign body, epistaxis or septal hematoma.     Right Turbinates: Enlarged and swollen. Not pale.     Left Turbinates: Enlarged and swollen. Not pale.     Right Sinus: No maxillary sinus tenderness or frontal sinus tenderness.     Left Sinus: No maxillary sinus tenderness or frontal sinus tenderness.     Mouth/Throat:     Lips: Pink. No lesions.     Mouth: Mucous membranes are moist. No oral lesions.     Dentition: Normal dentition. Does not have dentures. No dental tenderness, gingival swelling, dental caries, dental abscesses or gum lesions.     Tongue: No lesions. Tongue does not deviate from midline.     Palate: No mass and lesions.     Pharynx: Oropharynx is clear. Uvula midline. Posterior oropharyngeal erythema  present. No pharyngeal swelling, oropharyngeal exudate or uvula swelling.     Tonsils: No tonsillar exudate. 0 on the right. 0 on the left.     Comments: Postnasal drip Eyes:     General: Lids are normal.        Right eye: No discharge.        Left eye: No discharge.     Extraocular Movements: Extraocular movements intact.     Conjunctiva/sclera: Conjunctivae normal.  Right eye: Right conjunctiva is not injected.     Left eye: Left conjunctiva is not injected.  Neck:     Thyroid: No thyroid mass, thyromegaly or thyroid tenderness.     Trachea: Trachea and phonation normal.  Cardiovascular:     Rate and Rhythm: Normal rate and regular rhythm.     Pulses: Normal pulses.     Heart sounds: Normal heart sounds, S1 normal and S2 normal. Heart sounds not distant. No murmur heard.   No friction rub. No gallop.  Pulmonary:     Effort: Pulmonary effort is normal. No tachypnea, bradypnea, accessory muscle usage, prolonged expiration, respiratory distress or retractions.     Breath sounds: Normal breath sounds. No stridor, decreased air movement or transmitted upper airway sounds. No decreased breath sounds, wheezing, rhonchi or rales.  Chest:     Chest wall: No tenderness.  Abdominal:     General: Abdomen is flat. Bowel sounds are normal. There is no distension.     Palpations: Abdomen is soft.     Tenderness: There is no abdominal tenderness. There is no right CVA tenderness or left CVA tenderness.     Hernia: No hernia is present.  Musculoskeletal:        General: Normal range of motion.     Cervical back: Normal range of motion and neck supple. Normal range of motion.     Right lower leg: No edema.     Left lower leg: No edema.  Lymphadenopathy:     Cervical: No cervical adenopathy.  Skin:    General: Skin is warm and dry.     Findings: No erythema or rash.  Neurological:     General: No focal deficit present.     Mental Status: She is alert and oriented to person, place, and  time.  Psychiatric:        Mood and Affect: Mood normal.        Behavior: Behavior normal. Behavior is cooperative.    Visual Acuity Right Eye Distance:   Left Eye Distance:   Bilateral Distance:    Right Eye Near:   Left Eye Near:    Bilateral Near:     UC Couse / Diagnostics / Procedures:    EKG  Radiology No results found.  Procedures Procedures (including critical care time)  UC Diagnoses / Final Clinical Impressions(s)   I have reviewed the triage vital signs and the nursing notes.  Pertinent labs & imaging results that were available during my care of the patient were reviewed by me and considered in my medical decision making (see chart for details).   Final diagnoses:  Gastroesophageal reflux disease with esophagitis without hemorrhage  Seasonal allergic rhinitis, unspecified trigger   Given patient's epigastric pain that radiates to the base of her throat, I suspect the patient may be have developed H. pylori.  Patient certainly seems to have symptoms of gastroesophageal reflux disease particularly since she takes meloxicam every day.  I recommend that she begin Prevacid twice daily for a few weeks then decrease to once daily to see if this improves her symptoms.  I discussed H. pylori testing with her, advising her that she may need to discontinue Prevacid for a week prior to testing.  She has an upcoming appointment with her new primary care provider on November 14, 2021.  I have advised her to discuss testing with her new provider since we are unable to perform the simple test here at urgent care.  Given physical  exam findings concerning for allergies, I have also provided patient with a prescription for Allegra and nasal steroid.  2 nasal steroids were sent to the pharmacy for best price.  Patient advised to use both daily for the next several months during spring season.  Return precautions advised.  Patient advised to go to ED for worsening symptoms.  ED Prescriptions      Medication Sig Dispense Auth. Provider   lansoprazole (PREVACID) 30 MG capsule Take 1 capsule (30 mg total) by mouth 2 (two) times daily before a meal. 180 capsule Theadora Rama Scales, PA-C   fexofenadine (ALLEGRA) 180 MG tablet Take 1 tablet (180 mg total) by mouth daily. 90 tablet Theadora Rama Scales, PA-C   mometasone (NASONEX) 50 MCG/ACT nasal spray Place 2 sprays into the nose daily. 3 each Theadora Rama Scales, PA-C   fluticasone (FLONASE) 50 MCG/ACT nasal spray Place 2 sprays into both nostrils daily. 18 mL Theadora Rama Scales, PA-C      PDMP not reviewed this encounter.  Pending results:  Labs Reviewed - No data to display  Medications Ordered in UC: Medications - No data to display  Disposition Upon Discharge:  Condition: stable for discharge home Home: take medications as prescribed; routine discharge instructions as discussed; follow up as advised.  Patient presented with an acute illness with associated systemic symptoms and significant discomfort requiring urgent management. In my opinion, this is a condition that a prudent lay person (someone who possesses an average knowledge of health and medicine) may potentially expect to result in complications if not addressed urgently such as respiratory distress, impairment of bodily function or dysfunction of bodily organs.   Routine symptom specific, illness specific and/or disease specific instructions were discussed with the patient and/or caregiver at length.   As such, the patient has been evaluated and assessed, work-up was performed and treatment was provided in alignment with urgent care protocols and evidence based medicine.  Patient/parent/caregiver has been advised that the patient may require follow up for further testing and treatment if the symptoms continue in spite of treatment, as clinically indicated and appropriate.  If the patient was tested for COVID-19, Influenza and/or RSV, then the  patient/parent/guardian was advised to isolate at home pending the results of his/her diagnostic coronavirus test and potentially longer if theyre positive. I have also advised pt that if his/her COVID-19 test returns positive, it's recommended to self-isolate for at least 10 days after symptoms first appeared AND until fever-free for 24 hours without fever reducer AND other symptoms have improved or resolved. Discussed self-isolation recommendations as well as instructions for household member/close contacts as per the [facility] and [place] [facility], and also gave patient the COVID packet with this information.  Patient/parent/caregiver has been advised to return to the [facility] or PCP in 3-5 days if no better; to PCP or the Emergency Department if new signs and symptoms develop, or if the current signs or symptoms continue to change or worsen for further workup, evaluation and treatment as clinically indicated and appropriate  The patient will follow up with their current PCP if and as advised. If the patient does not currently have a PCP we will assist them in obtaining one.   The patient may need specialty follow up if the symptoms continue, in spite of conservative treatment and management, for further workup, evaluation, consultation and treatment as clinically indicated and appropriate.  Patient/parent/caregiver verbalized understanding and agreement of plan as discussed.  All questions were addressed during visit.  Please see  discharge instructions below for further details of plan.  Discharge Instructions:   Discharge Instructions      For your heartburn symptoms, which is causing pain in your throat and your upper abdomen, please begin Prevacid, 1 capsule twice daily for the next 2 weeks.  After 2 weeks, please feel free to reduce your dose to once daily.  I have provided you with a coupon for 180 capsules to use at [facility] without your insurance which is for a maximum price of $43.65.  Please discuss this  medication with your new primary care provider at your upcoming appointment on November 14, 2021.  Please also discuss having testing done for H. pylori infection.  H. pylori infection can cause significant worsening of heartburn symptoms, erosion of the esophagus, dry cough, sore throat and ulcers in the stomach.  H. pylori infection can also give you mild fever, body aches, cause nausea, make you feel less hungry and not able to eat as much food.  I have enclosed information about H. pylori for you to read.  For your allergy symptoms, I sent prescription for fexofenadine (Allegra) to your pharmacy.  [facility]'s over-the-counter price for this medication is $15.88.  You will not need a coupon for this.  If your insurance will not pay for this medication or if your insurance cost is higher than $15.88, ask [facility] not to run it through your insurance and to keep you there over the counter price.  I also sent a prescription for 2 nasal steroids, mometasone (Nasonex) and fluticasone (Flonase).  I cannot tell which one your insurance will pay for, if either.  Nasonex will be more expensive over-the-counter than Flonase well.  I sent both prescriptions so that you can choose the better price.  Both of them are equally effective.  Please spray 1 spray in each nare daily for best results.  Thank you for visiting urgent care today.  We appreciate the opportunity to participate in your care.    This office note has been dictated using Teaching laboratory technician.  Unfortunately, and despite my best efforts, this method of dictation can sometimes lead to occasional typographical or grammatical errors.  I apologize in advance if this occurs.     Theadora Rama Scales, PA-C 10/17/21 [phone]

## 2021-10-17 NOTE — ED Triage Notes (Signed)
Pt c/o sore throat and abd pain for over a week. ?

## 2021-10-24 ENCOUNTER — Ambulatory Visit: Payer: 59 | Admitting: Family Medicine

## 2021-10-31 ENCOUNTER — Encounter: Payer: Self-pay | Admitting: Family Medicine

## 2021-10-31 ENCOUNTER — Ambulatory Visit: Payer: 59 | Admitting: Family Medicine

## 2021-10-31 ENCOUNTER — Ambulatory Visit (HOSPITAL_BASED_OUTPATIENT_CLINIC_OR_DEPARTMENT_OTHER)
Admission: RE | Admit: 2021-10-31 | Discharge: 2021-10-31 | Disposition: A | Discharge status: Home / Self Care | Payer: 59 | Source: Ambulatory Visit | Attending: Family Medicine | Admitting: Family Medicine

## 2021-10-31 VITALS — BP 108/78 | Ht 62.0 in | Wt 158.0 lb

## 2021-10-31 DIAGNOSIS — M7672 Peroneal tendinitis, left leg: Secondary | ICD-10-CM

## 2021-10-31 DIAGNOSIS — M5416 Radiculopathy, lumbar region: Secondary | ICD-10-CM | POA: Insufficient documentation

## 2021-10-31 DIAGNOSIS — G629 Polyneuropathy, unspecified: Secondary | ICD-10-CM

## 2021-10-31 DIAGNOSIS — M25532 Pain in left wrist: Secondary | ICD-10-CM

## 2021-10-31 DIAGNOSIS — M25531 Pain in right wrist: Secondary | ICD-10-CM

## 2021-10-31 DIAGNOSIS — G5601 Carpal tunnel syndrome, right upper limb: Secondary | ICD-10-CM | POA: Diagnosis not present

## 2021-10-31 MED ORDER — MELOXICAM 15 MG PO TABS: 15.0000 mg | ORAL_TABLET | Freq: Every day | ORAL | 1 refills | Status: DC

## 2021-10-31 NOTE — Patient Instructions (Signed)
Good to see you  ?I have sent the refill of mobic  ?I will call with the xray and lab results.  ?Please send me a message in MyChart with any questions or updates.  ?Please do a virtual in 2-3 weeks if you are still having pain in your leg/foot.  ? ?--Dr. Jordan Likes ? ?

## 2021-10-31 NOTE — Progress Notes (Signed)
?  Christina Robertson [place] - 41 y.o. female MRN [id number]  Date of birth: July 07, 1981 ? ?SUBJECTIVE:  Including CC & ROS.  ?No chief complaint on file. ? ? ?Christina Robertson is a 41 y.o. female that is  presenting with ongoing heel pain and leg pain. Has a history of similar pain in her legs that improved with an epidural. Pain in joints intermittently. Having altered sensation in the legs as well. ? ?An in person interpretor was used for this interview  ? ? ?Review of Systems ?See HPI  ? ?HISTORY: Past Medical, Surgical, Social, and Family History Reviewed & Updated per EMR.   ?Pertinent Historical Findings include: ? ?History reviewed. No pertinent past medical history. ? ?History reviewed. No pertinent surgical history. ? ? ?PHYSICAL EXAM:  ?VS: BP 108/78 (BP Location: Left Arm, Patient Position: Sitting)   Ht 5\' 2"  (1.575 m)   Wt 158 lb (71.7 kg)   LMP 10/03/2021   BMI 28.90 kg/m?  ?Physical Exam ?Gen: NAD, alert, cooperative with exam, well-appearing ?MSK:  ?Neurovascularly intact   ? ? ? ? ?ASSESSMENT & PLAN:  ? ?Carpal tunnel syndrome of right wrist ?Has gotten some improvement with gabapentin  ?- counseled on home exercise therapy and supportive care ?- could consider nerve study or injection  ? ?Lumbar radiculopathy ?Acutely occurring. Has had similar pain that improved with epidural in the contralateral leg.  ?- counseled on home exercise therapy and supportive care ?- xray  ?- could consider further imaging.  ? ?Polyneuropathy ?Acutely occurring  ?- B12 and folate. Iron and ferritin  ? ?Peroneal tendinitis of lower leg, left ?Acutely occurring. Likely related to working long hours at her job.  ?- counseled on home exercise therapy and supportive care ?- mobic ?-xray ?- could consider injection or custom orthotics.  ? ?Pain of both wrist joints ?Acute occurring  ?- TSH, CMP and CBC ? ? ? ? ?

## 2021-10-31 NOTE — Assessment & Plan Note (Signed)
Has gotten some improvement with gabapentin  ?- counseled on home exercise therapy and supportive care ?- could consider nerve study or injection  ?

## 2021-10-31 NOTE — Assessment & Plan Note (Addendum)
Acutely occurring. Likely related to working long hours at her job.  ?- counseled on home exercise therapy and supportive care ?- mobic ?-xray ?- could consider injection or custom orthotics.  ?

## 2021-10-31 NOTE — Assessment & Plan Note (Signed)
Acute occurring  ?- TSH, CMP and CBC ?

## 2021-10-31 NOTE — Assessment & Plan Note (Signed)
Acutely occurring  ?- B12 and folate. Iron and ferritin  ?

## 2021-10-31 NOTE — Assessment & Plan Note (Signed)
Acutely occurring. Has had similar pain that improved with epidural in the contralateral leg.  ?- counseled on home exercise therapy and supportive care ?- xray  ?- could consider further imaging.  ?

## 2021-11-01 LAB — COMPREHENSIVE METABOLIC PANEL
ALT: 17 IU/L (ref 0–32)
AST: 14 IU/L (ref 0–40)
Albumin/Globulin Ratio: 1.7 (ref 1.2–2.2)
Albumin: 4.5 g/dL (ref 3.8–4.8)
Alkaline Phosphatase: 52 IU/L (ref 44–121)
BUN/Creatinine Ratio: 18 (ref 9–23)
BUN: 14 mg/dL (ref 6–24)
Bilirubin Total: 0.2 mg/dL (ref 0.0–1.2)
CO2: 21 mmol/L (ref 20–29)
Calcium: 9.8 mg/dL (ref 8.7–10.2)
Chloride: 103 mmol/L (ref 96–106)
Creatinine, Ser: 0.76 mg/dL (ref 0.57–1.00)
Globulin, Total: 2.6 g/dL (ref 1.5–4.5)
Glucose: 109 mg/dL — ABNORMAL HIGH (ref 70–99)
Potassium: 4.7 mmol/L (ref 3.5–5.2)
Sodium: 137 mmol/L (ref 134–144)
Total Protein: 7.1 g/dL (ref 6.0–8.5)
eGFR: 102 mL/min/{1.73_m2} (ref >59)

## 2021-11-01 LAB — IRON,TIBC AND FERRITIN PANEL
Ferritin: 27 ng/mL (ref 15–150)
Iron Saturation: 24 % (ref 15–55)
Iron: 93 ug/dL (ref 27–159)
Total Iron Binding Capacity: 394 ug/dL (ref 250–450)
UIBC: 301 ug/dL (ref 131–425)

## 2021-11-01 LAB — TSH: TSH: 3.4 u[IU]/mL (ref 0.450–4.500)

## 2021-11-01 LAB — CBC
Hematocrit: 40.3 % (ref 34.0–46.6)
Hemoglobin: 13.5 g/dL (ref 11.1–15.9)
MCH: 28.1 pg (ref 26.6–33.0)
MCHC: 33.5 g/dL (ref 31.5–35.7)
MCV: 84 fL (ref 79–97)
Platelets: 314 10*3/uL (ref 150–450)
RBC: 4.81 x10E6/uL (ref 3.77–5.28)
RDW: 12.8 % (ref 11.7–15.4)
WBC: 7.2 10*3/uL (ref 3.4–10.8)

## 2021-11-01 LAB — B12 AND FOLATE PANEL
Folate: 5.6 ng/mL (ref >3.0)
Vitamin B-12: 320 pg/mL (ref 232–1245)

## 2021-11-04 ENCOUNTER — Telehealth: Payer: Self-pay | Admitting: Family Medicine

## 2021-11-04 NOTE — Telephone Encounter (Signed)
Informed of results.  ? ?Rosemarie Ax, MD ?[facility] ?11/04/2021, 5:18 PM ? ?

## 2021-11-14 ENCOUNTER — Encounter: Payer: Self-pay | Admitting: Family Medicine

## 2021-11-14 ENCOUNTER — Ambulatory Visit (INDEPENDENT_AMBULATORY_CARE_PROVIDER_SITE_OTHER): Payer: 59 | Admitting: Family Medicine

## 2021-11-14 VITALS — BP 114/86 | HR 84 | Ht 62.0 in | Wt 168.8 lb

## 2021-11-14 DIAGNOSIS — M25572 Pain in left ankle and joints of left foot: Secondary | ICD-10-CM | POA: Diagnosis not present

## 2021-11-14 DIAGNOSIS — Z114 Encounter for screening for human immunodeficiency virus [HIV]: Secondary | ICD-10-CM | POA: Diagnosis not present

## 2021-11-14 DIAGNOSIS — M25541 Pain in joints of right hand: Secondary | ICD-10-CM | POA: Diagnosis not present

## 2021-11-14 DIAGNOSIS — M2550 Pain in unspecified joint: Secondary | ICD-10-CM | POA: Diagnosis not present

## 2021-11-14 DIAGNOSIS — M25542 Pain in joints of left hand: Secondary | ICD-10-CM

## 2021-11-14 DIAGNOSIS — M25512 Pain in left shoulder: Secondary | ICD-10-CM

## 2021-11-14 DIAGNOSIS — Z1159 Encounter for screening for other viral diseases: Secondary | ICD-10-CM | POA: Diagnosis not present

## 2021-11-14 DIAGNOSIS — M25511 Pain in right shoulder: Secondary | ICD-10-CM

## 2021-11-14 DIAGNOSIS — R519 Headache, unspecified: Secondary | ICD-10-CM

## 2021-11-14 DIAGNOSIS — M25571 Pain in right ankle and joints of right foot: Secondary | ICD-10-CM

## 2021-11-14 NOTE — Patient Instructions (Signed)
Adding ANA levels for your blood work - we will let you know results. Continue following with Dr. Jordan Likes since he has already started this workup.  ?

## 2021-11-14 NOTE — Progress Notes (Signed)
? ?______________________________________________________________________ ? ?HPI ?Christina Robertson is a 41 y.o. female presenting to [facility] at [facility] today to establish care.  Telephone interpreter used during visit. ? ?Patient Care Team: ?Clayborne Dana, NP as PCP - General (Family Medicine) ? ?Health Maintenance  ?Topic Date Due  ? COVID-19 Vaccine (1) Never done  ? Hepatitis C Screening: USPSTF Recommendation to screen - Ages 52-79 yo.  Never done  ? Tetanus Vaccine  Never done  ? Pap Smear  Never done  ? Flu Shot  03/03/2022  ? HIV Screening  Completed  ? HPV Vaccine  Aged Out  ? ? ? ?Concerns today: ? ?Patient is concerned regarding some ongoing joint pain.  States joints primarily affected include feet, hands/fingers, shoulders.  Reports the pain comes and goes anytime of the day but seems to be quite random.  The pain is sharp and achy.  She has been following with Dr. Jordan Likes who ordered x-rays and lab work 2 weeks ago which was all stable.  She is scheduled to see him on 11/21/2021 to talk about further work-up.  She denies any new symptoms since seeing him. ? ?Headaches: Patient reports she has been getting occasional headaches, maybe once a week.  States they are usually moderate to severe but are able to respond to 4 mg of ibuprofen pretty quickly.  Reports that when she has the headaches she does not have any vision changes, nausea, vomiting, photo/phonophobia.  Reports that headaches have been primarily occurring the past several weeks while fasting for Ramadan which ends April 21.  States that she is not able to drink/eat anything during daylight.  Admits that she is probably getting dehydrated. ? ? ? ? ? ? ?Patient Active Problem List  ? Diagnosis Date Noted  ? Lumbar radiculopathy 10/31/2021  ? Pain of both wrist joints 10/31/2021  ? Polyneuropathy 10/31/2021  ? 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 Peroneal tendinitis of lower leg, left 09/26/2021  ?  Acute otitis externa of left ear 01/18/2021  ? Back pain 01/04/2021  ? Sore throat 01/04/2021  ? ?______________________________________________________________ ?PMH ?No past medical history on file. ? ?ROS ?All review of systems negative except what is listed in the HPI ? ?PHYSICAL EXAM ?Physical Exam ?Vitals reviewed.  ?Constitutional:   ?   Appearance: Normal appearance.  ?Eyes:  ?   Conjunctiva/sclera: Conjunctivae normal.  ?Cardiovascular:  ?   Rate and Rhythm: Normal rate and regular rhythm.  ?   Pulses: Normal pulses.  ?   Heart sounds: Normal heart sounds.  ?Pulmonary:  ?   Effort: Pulmonary effort is normal.  ?   Breath sounds: Normal breath sounds.  ?Musculoskeletal:  ?   Cervical back: Normal range of motion and neck supple.  ?Skin: ?   General: Skin is warm and dry.  ?   Capillary Refill: Capillary refill takes less than 2 seconds.  ?Neurological:  ?   General: No focal deficit present.  ?   Mental Status: She is alert and oriented to person, place, and time. Mental status is at baseline.  ?Psychiatric:     ?   Mood and Affect: Mood normal.     ?   Behavior: Behavior normal.     ?   Thought Content: Thought content normal.     ?   Judgment: Judgment normal.  ? ?______________________________________________________________________ ?ASSESSMENT AND PLAN ? ?1. Arthralgia, unspecified joint ?Currently being worked up by sports medicine, Dr. Jordan Likes.  She is scheduled to see him next week.  Previous labs done have been normal, but will add ANA.  Patient aware of signs/symptoms requiring further evaluation. ?- ANA ? ?2. Encounter for screening for HIV ?- HIV Antibody (routine testing w rflx) ? ?3. Encounter for hepatitis C screening test for low risk patient ?- Hepatitis C antibody ? ?4.  Headaches ?States that she is unable to break her fast during Ramadan.  Therefore recommend that she try waking up early enough before sunrise to drink plenty of water and eat a good meal before she has to start fasting.   This is likely the reason for her headaches.  BP stable. Recommend that she monitor occasionally at home.  She is aware of any " red flag" symptoms requiring urgent evaluation. ? ?Establish care ?Education provided today during visit and on AVS for patient to review at home.  ?Diet and Exercise recommendations provided.  ?Current diagnoses and recommendations discussed. ?HM recommendations reviewed with recommendations.  ? ? ?Outpatient Encounter Medications as of 11/14/2021  ?Medication Sig  ? gabapentin (NEURONTIN) 100 MG capsule Take 1 capsule (100 mg total) by mouth 3 (three) times daily as needed.  ? lansoprazole (PREVACID) 30 MG capsule Take 1 capsule (30 mg total) by mouth 2 (two) times daily before a meal.  ? meloxicam (MOBIC) 15 MG tablet Take 1 tablet (15 mg total) by mouth daily.  ? [DISCONTINUED] DULoxetine (CYMBALTA) 20 MG capsule Take 1 capsule (20 mg total) by mouth daily.  ? [DISCONTINUED] fexofenadine (ALLEGRA) 180 MG tablet Take 1 tablet (180 mg total) by mouth daily.  ? [DISCONTINUED] fluticasone (FLONASE) 50 MCG/ACT nasal spray Place 2 sprays into both nostrils daily.  ? [DISCONTINUED] mometasone (NASONEX) 50 MCG/ACT nasal spray Place 2 sprays into the nose daily.  ? ?No facility-administered encounter medications on file as of 11/14/2021.  ? ? ?Return in about 3 months (around 02/13/2022) for routine f/u . ? ? ?I spent 30 minutes dedicated to the care of this patient on the date of this encounter to include pre-visit chart review of prior notes and results, face-to-face time with the patient performing a medically appropriate exam, counseling/education regarding joint pain, headaches, blood pressure, and post-visit documentation and ordering of labs as indicated.  ? ? ?Lollie Marrow Reola Calkins, DNP, FNP-C ? ? ?

## 2021-11-14 NOTE — Progress Notes (Signed)
Foot pain ?Sharp pains all over body x3-4 months ?

## 2021-11-17 LAB — HEPATITIS C ANTIBODY
Hepatitis C Ab: NONREACTIVE
SIGNAL TO CUT-OFF: 0.08 (ref <1.00)

## 2021-11-17 LAB — HIV ANTIBODY (ROUTINE TESTING W REFLEX): HIV 1&2 Ab, 4th Generation: NONREACTIVE

## 2021-11-17 LAB — ANA: Anti Nuclear Antibody (ANA): NEGATIVE

## 2021-11-18 ENCOUNTER — Encounter: Payer: Self-pay | Admitting: *Deleted

## 2021-11-21 ENCOUNTER — Encounter: Payer: Self-pay | Admitting: Family Medicine

## 2021-11-21 ENCOUNTER — Telehealth (INDEPENDENT_AMBULATORY_CARE_PROVIDER_SITE_OTHER): Payer: 59 | Admitting: Family Medicine

## 2021-11-21 DIAGNOSIS — M7672 Peroneal tendinitis, left leg: Secondary | ICD-10-CM

## 2021-11-21 NOTE — Progress Notes (Signed)
Virtual Visit via Video Note ? ?I connected with Christina Robertson on 11/21/21 at 10:30 AM EDT by a video enabled telemedicine application and verified that I am speaking with the correct person using two identifiers. ? ?Location: ?Patient: home ?Provider: office ?  ?I discussed the limitations of evaluation and management by telemedicine and the availability of in person appointments. The patient expressed understanding and agreed to proceed. ? ?History of Present Illness: ? ?Christina Robertson is a 41 year old female that is following up after the ongoing pain in her left foot.  The pain is still severe in nature. ?  ?Observations/Objective: ? ? ?Assessment and Plan: ? ?Peroneal tendinitis of left lower leg: ?Acute on chronic in nature.  She is still having pain over the lateral aspect of the foot and ankle.  No improvement with medications.  Has been under greater than 6 weeks of physician directed home exercise therapy.  X-ray of the foot and ankle has been unrevealing. ?-Counseled on home exercise therapy and supportive care. ?-MRI of the left ankle to evaluate for impingement and for presurgical planning. ? ?Follow Up Instructions: ? ?  ?I discussed the assessment and treatment plan with the patient. The patient was provided an opportunity to ask questions and all were answered. The patient agreed with the plan and demonstrated an understanding of the instructions. ?  ?The patient was advised to call back or seek an in-person evaluation if the symptoms worsen or if the condition fails to improve as anticipated. ? ? ?Clare Gandy, MD ? ? ?

## 2021-11-21 NOTE — Assessment & Plan Note (Signed)
Acute on chronic in nature.  She is still having pain over the lateral aspect of the foot and ankle.  No improvement with medications.  Has been under greater than 6 weeks of physician directed home exercise therapy.  X-ray of the foot and ankle has been unrevealing. ?-Counseled on home exercise therapy and supportive care. ?-MRI of the left ankle to evaluate for impingement and for presurgical planning. ?

## 2022-02-13 ENCOUNTER — Ambulatory Visit: Payer: 59 | Admitting: Family Medicine

## 2022-02-23 ENCOUNTER — Other Ambulatory Visit: Payer: Self-pay | Admitting: Family Medicine

## 2022-02-23 DIAGNOSIS — M5416 Radiculopathy, lumbar region: Secondary | ICD-10-CM

## 2022-03-01 ENCOUNTER — Other Ambulatory Visit: Payer: Self-pay | Admitting: Family Medicine

## 2022-11-16 ENCOUNTER — Encounter: Payer: Self-pay | Admitting: *Deleted

## 2023-01-14 IMAGING — DX DG LUMBAR SPINE 2-3V
3 series · 3 of 3 positions shown · non-contrast
Comparison: None

CLINICAL DATA: Low back pain

EXAM:
LUMBAR SPINE - 2-3 VIEW

[l-spine ap]
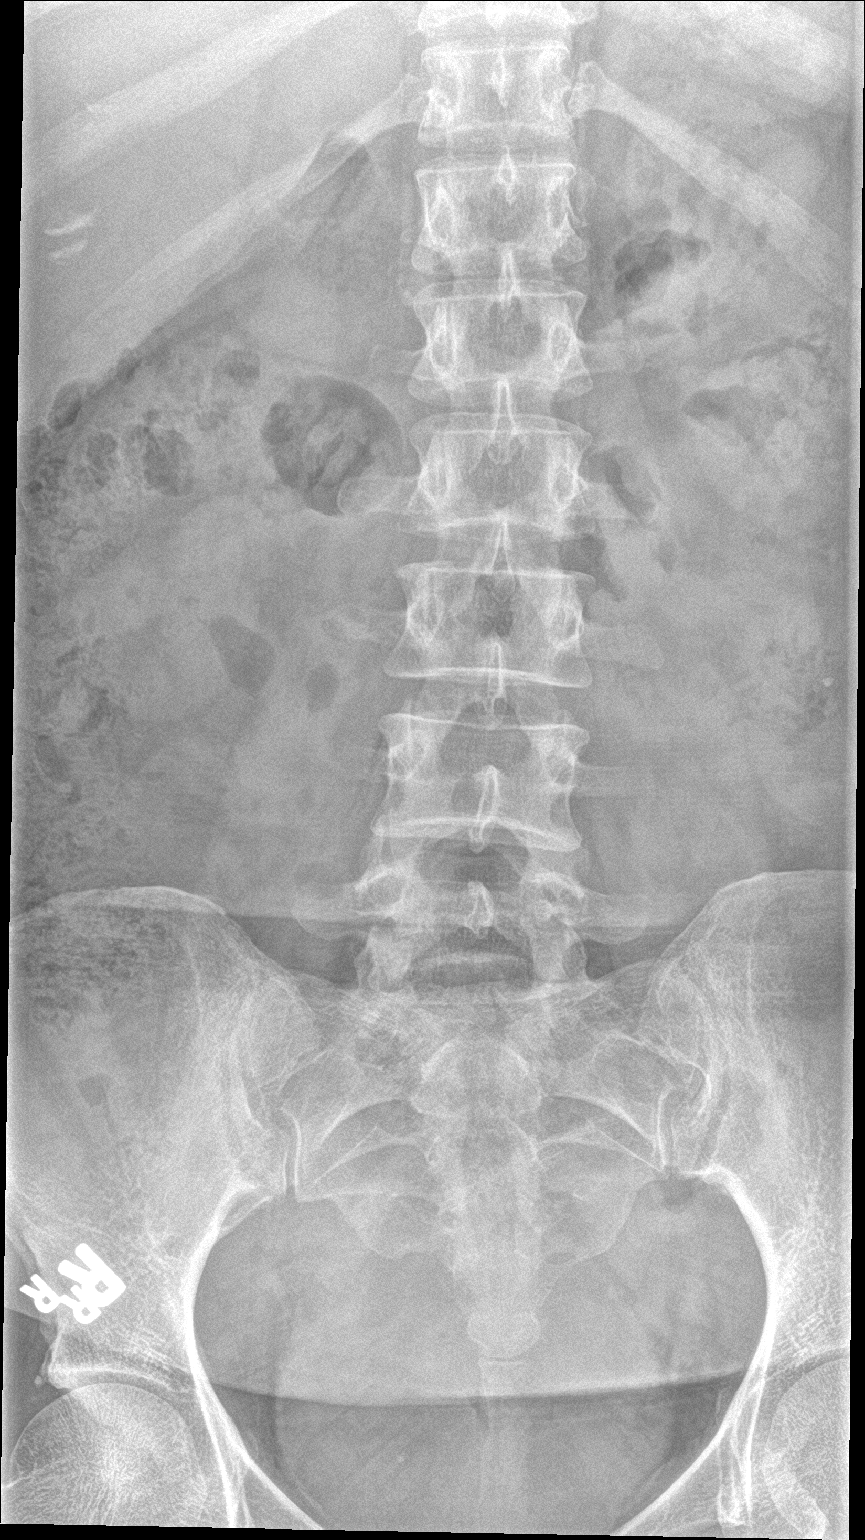

[l-spine lat]
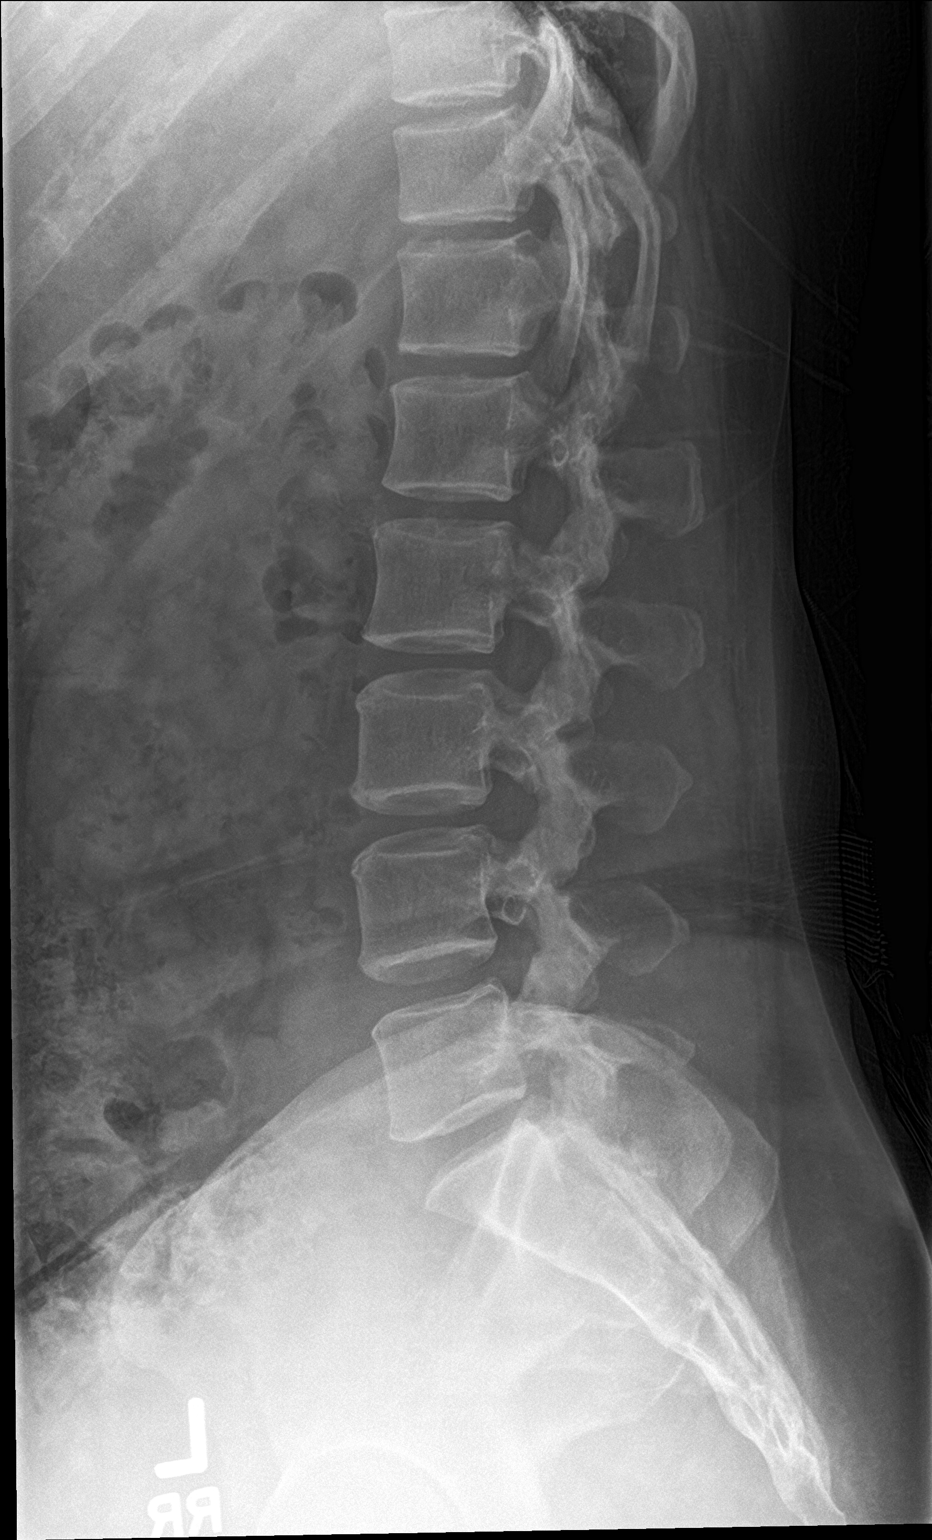

[l-spine spot]
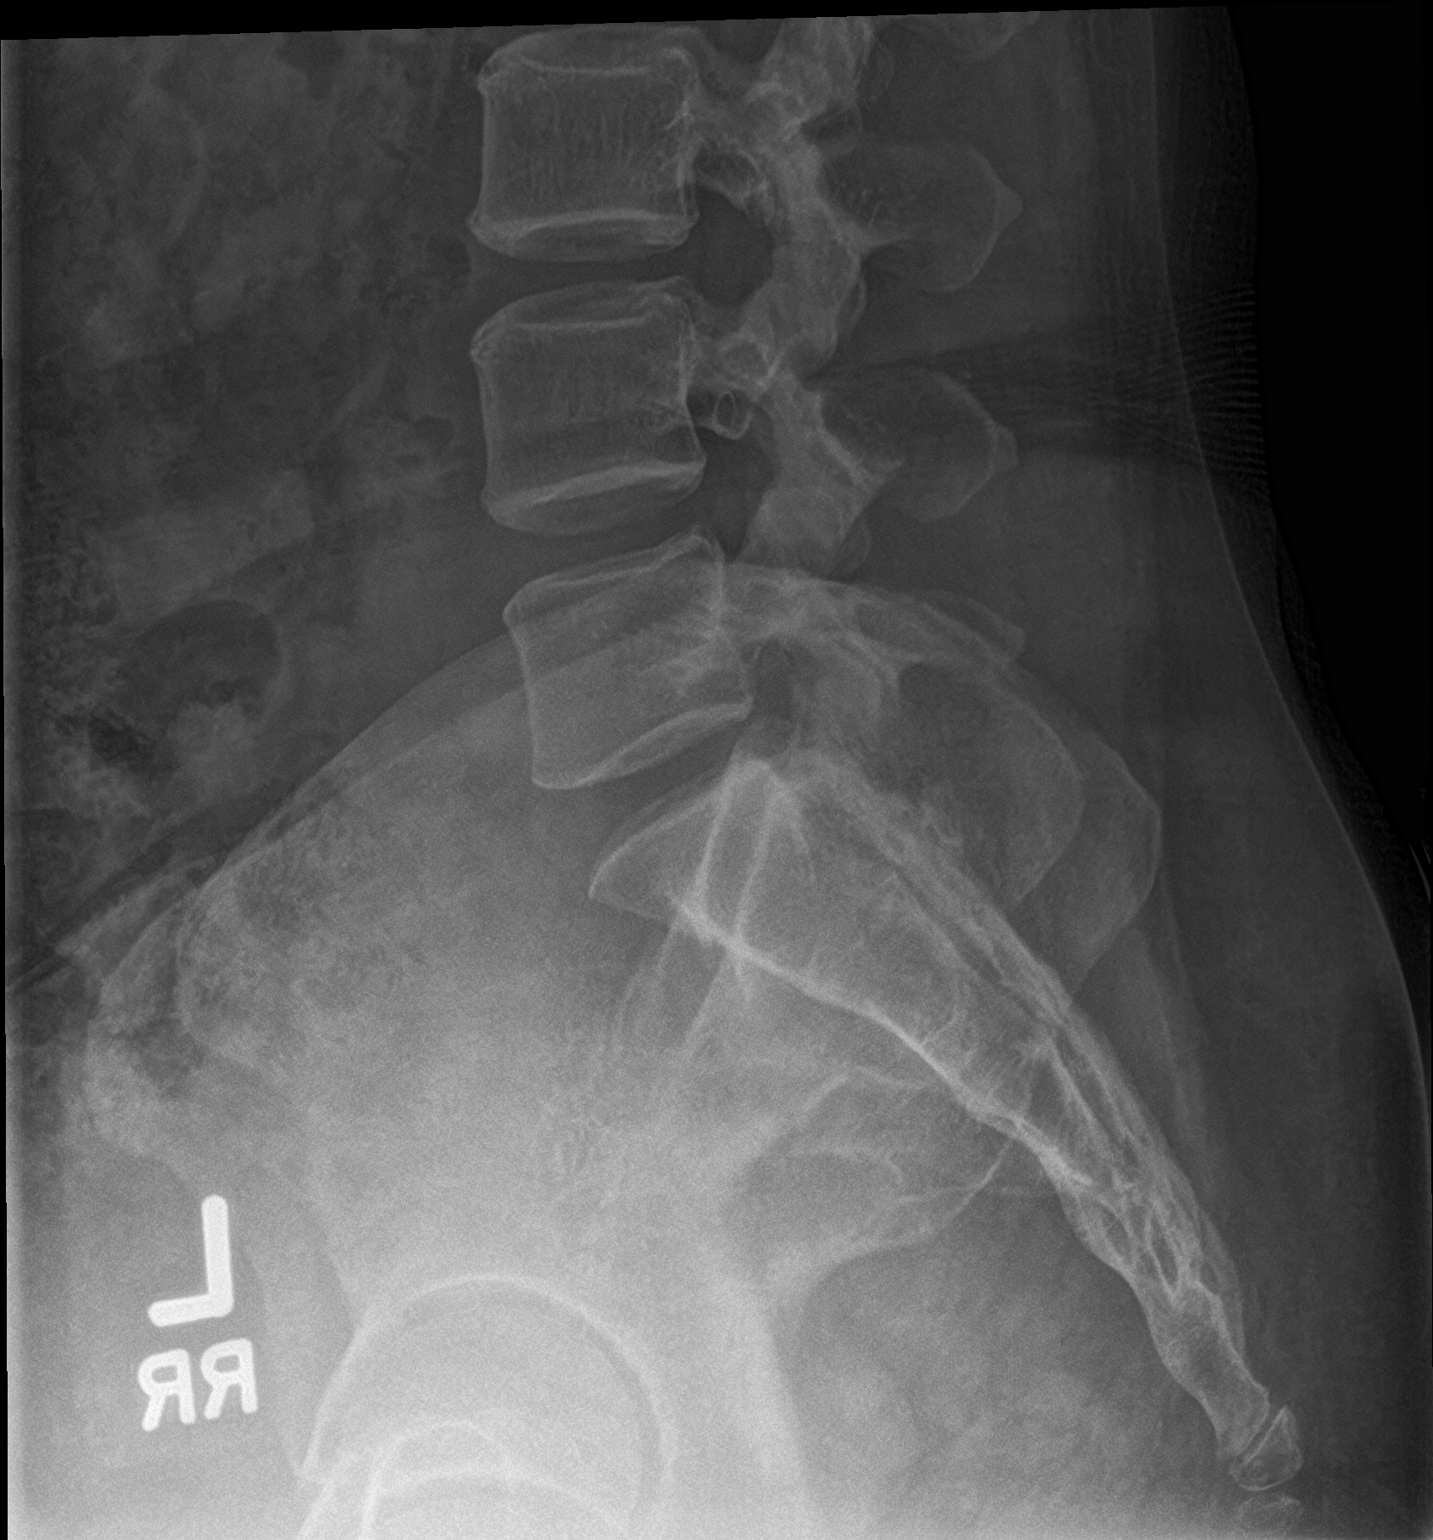

[3 of 3 positions shown; findings below may reference images not displayed]

FINDINGS: There are 6 non-rib-bearing vertebrae in the lumbar region. No
recent fracture is seen. There is no significant disc space
narrowing. Degenerative changes are noted with small anterior bony
spurs and facet hypertrophy. Facet hypertrophy is more prominent at
L4-L5 and L5-S1 levels.
IMPRESSION: No recent fracture is seen. Degenerative changes are noted,
particularly prominent in the facet joints at L4-L5 and L5-S1
levels.

## 2023-01-14 IMAGING — DX DG FOOT COMPLETE 3+V*L*
3 series · 3 of 3 positions shown · non-contrast
Comparison: None.

CLINICAL DATA: Pain in the left heel

EXAM:
LEFT FOOT - COMPLETE 3+ VIEW

[foot ap]
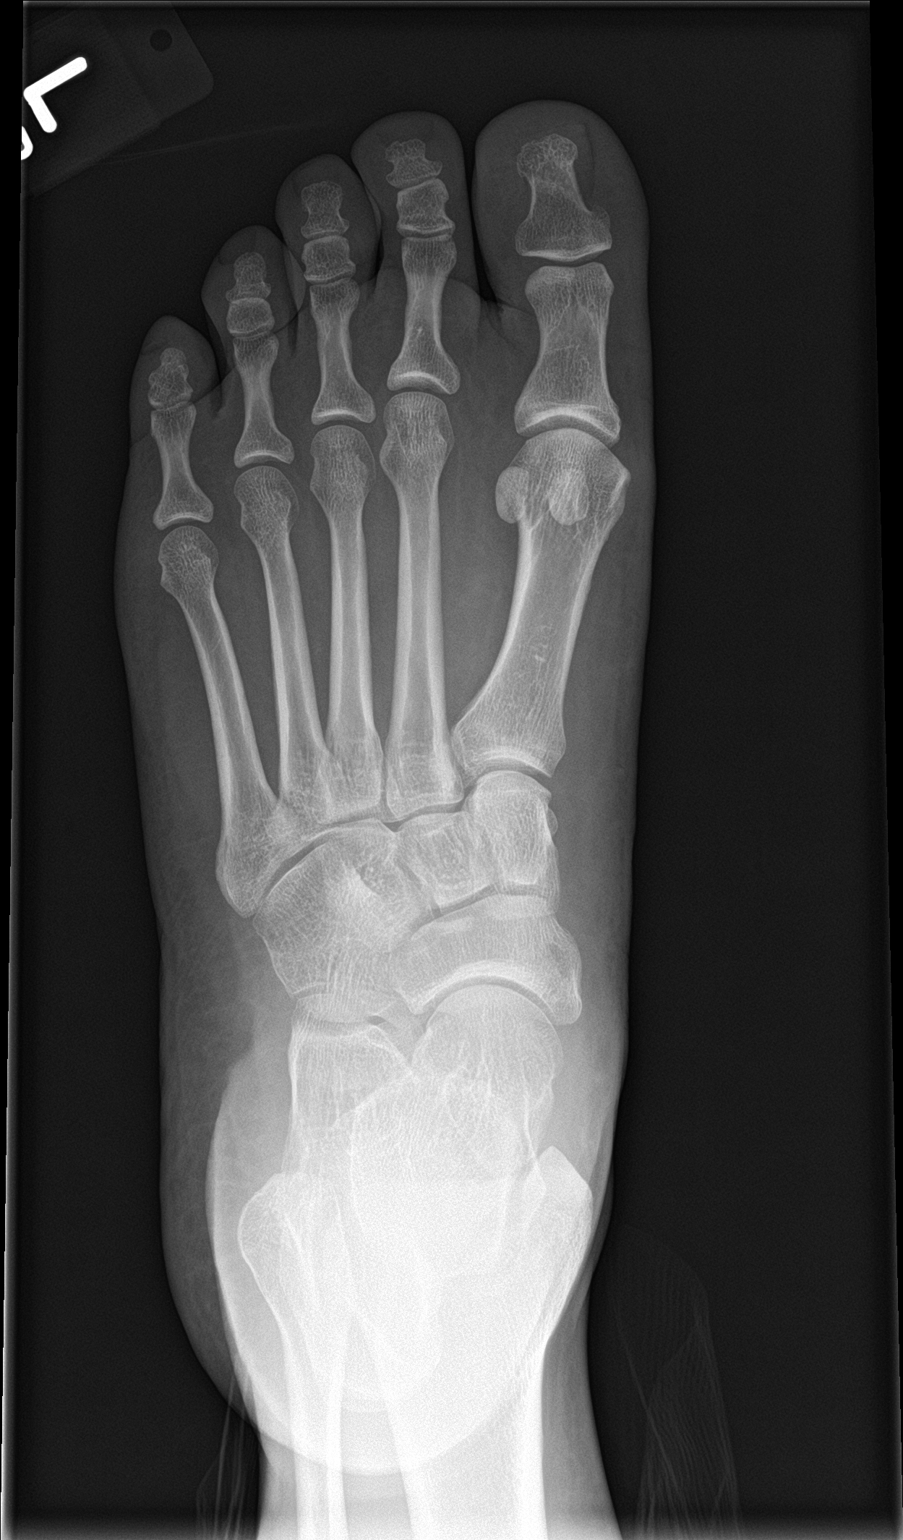

[foot obl]
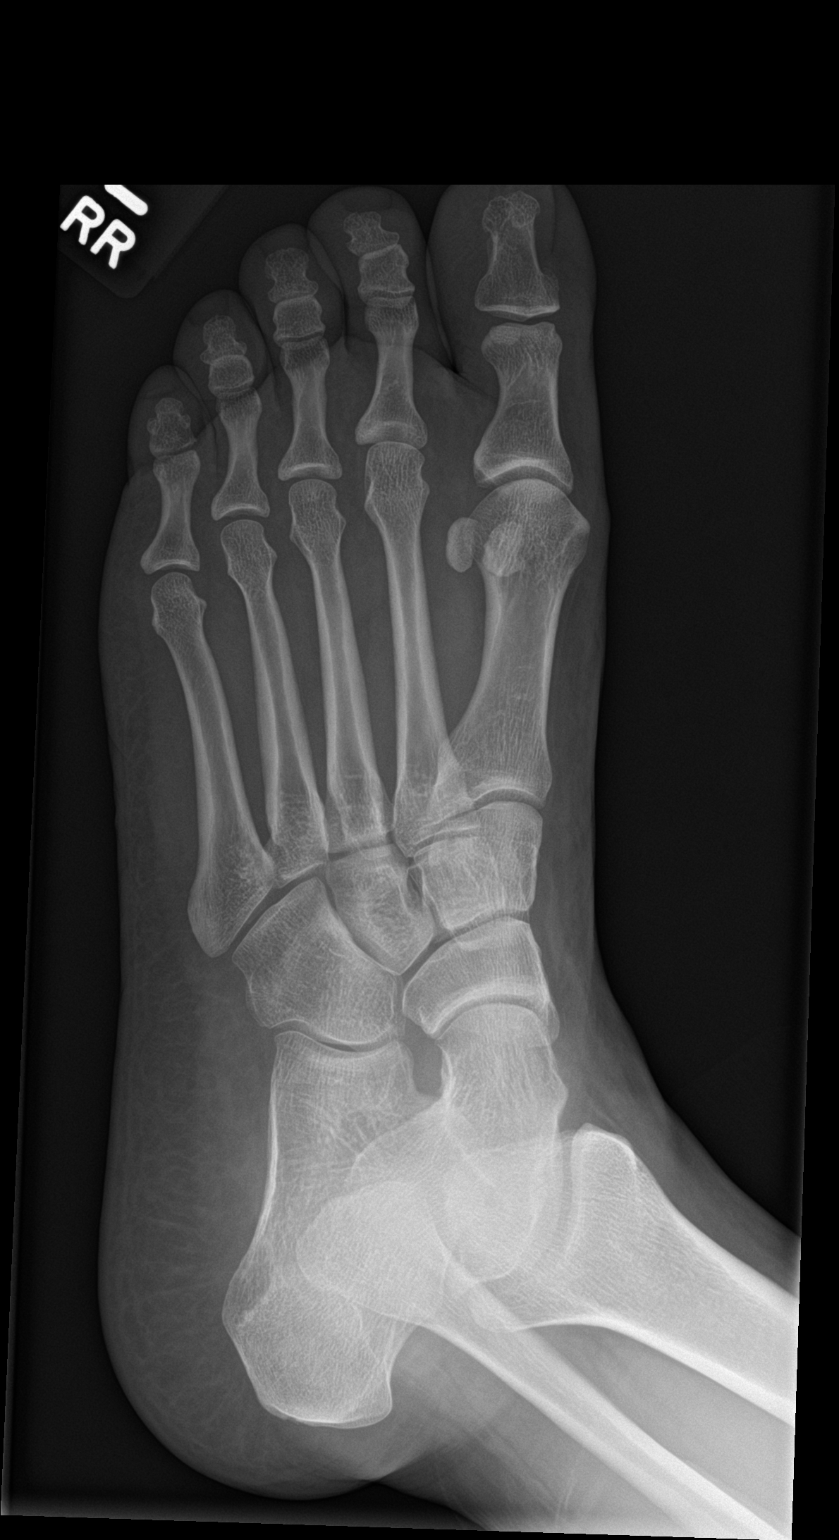

[foot lat]
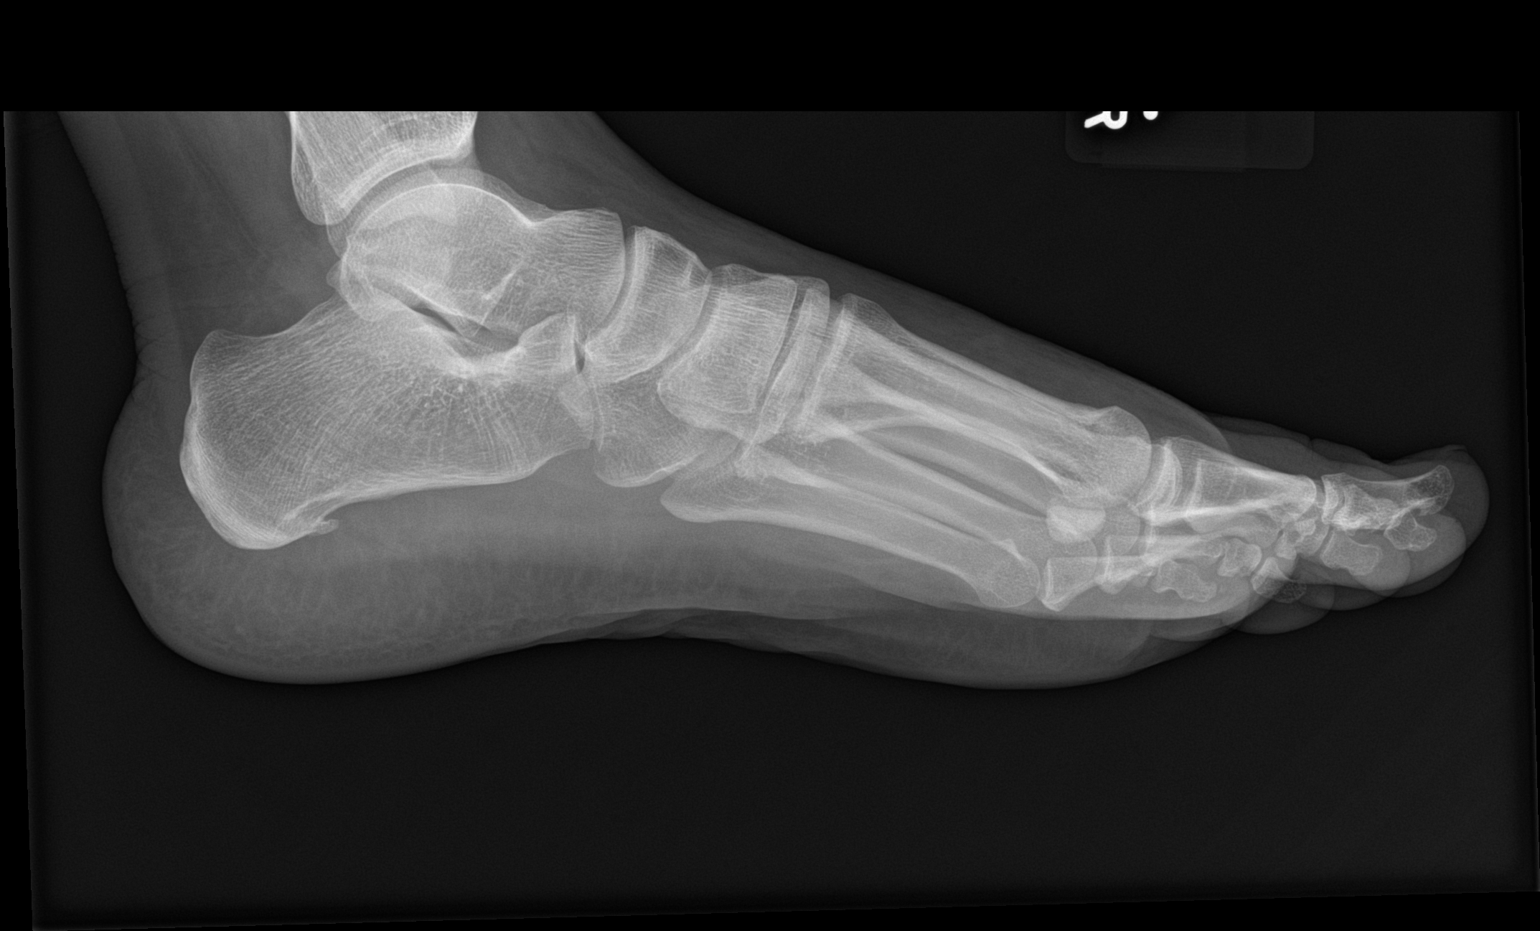

[3 of 3 positions shown; findings below may reference images not displayed]

FINDINGS: No recent fracture or dislocation is seen. Small plantar spur is
seen in calcaneus.
IMPRESSION: No fracture or dislocation is seen. Small plantar spur is seen in
the left calcaneus.
